# Patient Record
Sex: Female | Born: 1945 | Race: White | Hispanic: No | Marital: Married | Smoking: Never smoker
Health system: Southern US, Community
[De-identification: ages and names within clinical notes are randomized; demographics above are authoritative.]

## PROBLEM LIST (undated history)

## (undated) DIAGNOSIS — E079 Disorder of thyroid, unspecified: Secondary | ICD-10-CM

## (undated) DIAGNOSIS — I10 Essential (primary) hypertension: Secondary | ICD-10-CM

## (undated) DIAGNOSIS — I639 Cerebral infarction, unspecified: Secondary | ICD-10-CM

## (undated) HISTORY — PX: TONSILLECTOMY: SUR1361

---

## 2005-12-18 ENCOUNTER — Other Ambulatory Visit: Payer: Self-pay

## 2005-12-18 ENCOUNTER — Emergency Department: Payer: Self-pay | Admitting: Emergency Medicine

## 2006-10-29 ENCOUNTER — Ambulatory Visit: Payer: Self-pay

## 2006-11-02 ENCOUNTER — Ambulatory Visit: Payer: Self-pay

## 2007-05-17 ENCOUNTER — Ambulatory Visit: Payer: Self-pay | Admitting: General Surgery

## 2007-11-12 ENCOUNTER — Ambulatory Visit: Payer: Self-pay | Admitting: General Surgery

## 2008-06-13 ENCOUNTER — Emergency Department: Payer: Self-pay | Admitting: Emergency Medicine

## 2012-02-26 DIAGNOSIS — E039 Hypothyroidism, unspecified: Secondary | ICD-10-CM | POA: Insufficient documentation

## 2014-05-16 DIAGNOSIS — E782 Mixed hyperlipidemia: Secondary | ICD-10-CM | POA: Diagnosis present

## 2016-11-26 ENCOUNTER — Emergency Department: Payer: Medicare Other

## 2016-11-26 ENCOUNTER — Emergency Department
Admission: EM | Admit: 2016-11-26 | Discharge: 2016-11-27 | Disposition: A | Discharge status: Home / Self Care | Payer: Medicare Other | Attending: Emergency Medicine | Admitting: Emergency Medicine

## 2016-11-26 DIAGNOSIS — I10 Essential (primary) hypertension: Secondary | ICD-10-CM | POA: Insufficient documentation

## 2016-11-26 DIAGNOSIS — R42 Dizziness and giddiness: Secondary | ICD-10-CM | POA: Diagnosis present

## 2016-11-26 DIAGNOSIS — Z8673 Personal history of transient ischemic attack (TIA), and cerebral infarction without residual deficits: Secondary | ICD-10-CM | POA: Diagnosis not present

## 2016-11-26 DIAGNOSIS — R4701 Aphasia: Secondary | ICD-10-CM | POA: Diagnosis not present

## 2016-11-26 HISTORY — DX: Essential (primary) hypertension: I10

## 2016-11-26 HISTORY — DX: Cerebral infarction, unspecified: I63.9

## 2016-11-26 HISTORY — DX: Disorder of thyroid, unspecified: E07.9

## 2016-11-26 LAB — URINALYSIS, COMPLETE (UACMP) WITH MICROSCOPIC
Bilirubin Urine: NEGATIVE
Glucose, UA: NEGATIVE mg/dL
Hgb urine dipstick: NEGATIVE
KETONES UR: 5 mg/dL — AB
Nitrite: NEGATIVE
PROTEIN: NEGATIVE mg/dL
Specific Gravity, Urine: 1.019 (ref 1.005–1.030)
pH: 5 (ref 5.0–8.0)

## 2016-11-26 LAB — BASIC METABOLIC PANEL
ANION GAP: 10 (ref 5–15)
BUN: 16 mg/dL (ref 6–20)
CALCIUM: 10.2 mg/dL (ref 8.9–10.3)
CO2: 27 mmol/L (ref 22–32)
CREATININE: 0.72 mg/dL (ref 0.44–1.00)
Chloride: 103 mmol/L (ref 101–111)
GFR calc Af Amer: >60 mL/min (ref >60)
GLUCOSE: 104 mg/dL — AB (ref 65–99)
Potassium: 4.1 mmol/L (ref 3.5–5.1)
Sodium: 140 mmol/L (ref 135–145)

## 2016-11-26 LAB — CBC
HEMATOCRIT: 41 % (ref 35.0–47.0)
Hemoglobin: 14.4 g/dL (ref 12.0–16.0)
MCH: 31.3 pg (ref 26.0–34.0)
MCHC: 35.1 g/dL (ref 32.0–36.0)
MCV: 89.1 fL (ref 80.0–100.0)
Platelets: 222 10*3/uL (ref 150–440)
RBC: 4.6 MIL/uL (ref 3.80–5.20)
RDW: 13.4 % (ref 11.5–14.5)
WBC: 6.6 10*3/uL (ref 3.6–11.0)

## 2016-11-26 NOTE — ED Notes (Signed)
Patient transported to MRI by Narda Rutherford, NT

## 2016-11-26 NOTE — ED Provider Notes (Signed)
[facility] [place] Emergency Department Provider Note   First MD Initiated Contact with Patient 11/26/16 2307     (approximate)  I have reviewed the triage vital signs and the nursing notes.   HISTORY  Chief Complaint Dizziness   HPI Leah Jacobs is a 70 y.o. female with blow list of chronic medical conditions presents to the emergency department with two-year history of expressive aphasia and vertiginous symptoms. Patient denies any headache no nausea vomiting. Patient denies any weakness numbness. Per the patient's husband the patient refused to seek medical attention until recently regarding her symptoms.   Past Medical History:  Diagnosis Date  . Hypertension   . Stroke (HCC)   . Thyroid disease     There are no active problems to display for this patient.   Past Surgical History:  Procedure Laterality Date  . TONSILLECTOMY      Prior to Admission medications   Not on File    Allergies Gadolinium derivatives and Neosporin [neomycin-bacitracin zn-polymyx]  No family history on file.  Social History Social History  Substance Use Topics  . Smoking status: Never Smoker  . Smokeless tobacco: Never Used  . Alcohol use Yes    Review of Systems Constitutional: No fever/chills Eyes: No visual changes. ENT: No sore throat. Cardiovascular: Denies chest pain. Respiratory: Denies shortness of breath. Gastrointestinal: No abdominal pain.  No nausea, no vomiting.  No diarrhea.  No constipation. Genitourinary: Negative for dysuria. Musculoskeletal: Negative for neck pain.  Negative for back pain. Integumentary: Negative for rash. Neurological: Negative for headaches, focal weakness or numbness.positive for expressive aphasia and vertigo   ____________________________________________   PHYSICAL EXAM:  VITAL SIGNS: ED Triage Vitals [11/26/16 1840]  Enc Vitals Group     BP (!) 146/63     Pulse Rate (!) 59     Resp 16     Temp 97.7 F  (36.5 C)     Temp Source Oral     SpO2 98 %     Weight 72.1 kg (159 lb)     Height 1.575 m ( )     Head Circumference      Peak Flow      Pain Score      Pain Loc      Pain Edu?      Excl. in GC?     Constitutional: Alert and oriented. Well appearing and in no acute distress. Eyes: Conjunctivae are normal.  Head: Atraumatic. Mouth/Throat: Mucous membranes are moist.  Oropharynx non-erythematous. Neck: No stridor.  Cardiovascular: Normal rate, regular rhythm. Good peripheral circulation. Grossly normal heart sounds. Respiratory: Normal respiratory effort.  No retractions. Lungs CTAB. Gastrointestinal: Soft and nontender. No distention.  Musculoskeletal: No lower extremity tenderness nor edema. No gross deformities of extremities. Neurologic:  expressive aphasia. No gross focal neurologic deficits are appreciated.  Skin:  Skin is warm, dry and intact. No rash noted. Psychiatric: Mood and affect are normal. Speech and behavior are normal.  ____________________________________________   LABS (all labs ordered are listed, but only abnormal results are displayed)  Labs Reviewed  BASIC METABOLIC PANEL - Abnormal; Notable for the following:       Result Value   Glucose, Bld 104 (*)    All other components within normal limits  URINALYSIS, COMPLETE (UACMP) WITH MICROSCOPIC - Abnormal; Notable for the following:    Color, Urine AMBER (*)    APPearance HAZY (*)    Ketones, ur 5 (*)    Leukocytes, UA SMALL (*)  Bacteria, UA RARE (*)    Squamous Epithelial / LPF 0-5 (*)    All other components within normal limits  CBC   ____________________________________________  EKG  ED ECG REPORT I, Juneau N Kensleigh Gates, the attending physician, personally viewed and interpreted this ECG.   Date: 11/26/2016  EKG Time:6:48 PM  Rate: 58  Rhythm: Sinus bradycardia  Axis: Normal  Intervals:Normal  ST&T Change: none  ____________________________________________  RADIOLOGY I,  Hall N Grayce Budden, personally viewed and evaluated these images (plain radiographs) as part of my medical decision making, as well as reviewing the written report by the radiologist.  CLINICAL DATA:  Central vertigo  EXAM: MRA NECK WITHOUT CONTRAST  MRA HEAD WITHOUT CONTRAST  TECHNIQUE: Angiographic images of the neck were obtained using MRA technique without intravenous contrast.; Angiographic images of the Circle of Willis were obtained using MRA technique without intravenous contrast.  COMPARISON:  None.  FINDINGS: MRA NECK FINDINGS  Left dominant vertebral system. Normal course and caliber of the vertebral arteries along their V2 segments. Normal appearance of the common and internal carotid arteries bilaterally.  MRA HEAD FINDINGS  Intracranial internal carotid arteries: Normal.  Anterior cerebral arteries: Normal.  Middle cerebral arteries: Normal.  Posterior communicating arteries: Present bilaterally.  Posterior cerebral arteries: Normal.  Basilar artery: Normal.  Vertebral arteries: Left dominant. Normal.  Superior cerebellar arteries: Normal.  Anterior inferior cerebellar arteries: Normal.  Posterior inferior cerebellar arteries: Normal.  IMPRESSION: Normal MRA of the head and neck.   Electronically Signed   By: Deatra Robinson M.D.   On: 11/27/2016 01:14  CLINICAL DATA:  Dizziness and aphasia  EXAM: MRI HEAD WITHOUT CONTRAST  TECHNIQUE: Multiplanar, multiecho pulse sequences of the brain and surrounding structures were obtained without intravenous contrast.  COMPARISON:  MRA of the head and neck 11/26/2016  FINDINGS: Brain: The midline structures are normal. There is no focal diffusion restriction to indicate acute infarct. Mild white matter changes for age. No intraparenchymal hematoma or chronic microhemorrhage. Brain volume is normal for age without lobar predominant atrophy. The dura is normal and there is no  extra-axial collection.  Vascular: Major intracranial arterial and venous sinus flow voids are preserved.  Skull and upper cervical spine: The visualized skull base, calvarium, upper cervical spine and extracranial soft tissues are normal.  Sinuses/Orbits: No fluid levels or advanced mucosal thickening. No mastoid or middle ear effusion. Normal orbits.  IMPRESSION: 1. No acute intracranial abnormality or finding to explain the reported dizziness. 2. Mild atrophy and chronic ischemic white matter changes.   Electronically Signed   By: Deatra Robinson M.D.   On: 11/27/2016 03:22   Procedures   ____________________________________________   INITIAL IMPRESSION / ASSESSMENT AND PLAN / ED COURSE  Pertinent labs & imaging results that were available during my care of the patient were reviewed by me and considered in my medical decision making (see chart for details).  71 year old female presenting with above stated history of physical exam. No clear exudation for the patient's expressive aphasia noted. MRI/MRA of the brain revealed no acute intracranial abnormality. Patient referred to primary care provider for further outpatient evaluation for chronic expressive aphasia 2 years     ____________________________________________  FINAL CLINICAL IMPRESSION(S) / ED DIAGNOSES  Final diagnoses:  Expressive aphasia     MEDICATIONS GIVEN DURING THIS VISIT:  Medications - No data to display   NEW OUTPATIENT MEDICATIONS STARTED DURING THIS VISIT:  There are no discharge medications for this patient.   There are no discharge medications  for this patient.   There are no discharge medications for this patient.    Note:  This document was prepared using Dragon voice recognition software and may include unintentional dictation errors.    Darci Current, MD 11/28/16 [phone]

## 2016-11-26 NOTE — ED Notes (Signed)
Pt has had dizziness for approximately a year. Pt has been on two medications that cause dizziness. Pt's doctor told her to come here to be evaluated. Pt denying HA or visual changes. Pt stating "the room is spinning." Pt's stating that she is supposed to be following up with a neurologist for her sx at 02/06/2017. Pt denying n/v.

## 2016-11-26 NOTE — ED Triage Notes (Signed)
Pt is here with her husband, states she has been having dizziness for several months and is not getting any relief with meclizine. Denies N/V/D or other neuro changes at present. Pt has expressive aphasia from previous.

## 2016-11-27 ENCOUNTER — Emergency Department: Payer: Medicare Other

## 2016-11-27 DIAGNOSIS — R4701 Aphasia: Secondary | ICD-10-CM | POA: Diagnosis not present

## 2016-11-27 NOTE — ED Notes (Signed)
Pt and pt's family updated that the doctor would be there to see her soon to discuss results.

## 2016-11-27 NOTE — ED Notes (Signed)
Nurse updated pt and pt's family that pt will have to return to MRI for an additional scan. Nurse explained that we needed a different type of picture of the brain to make sure that there was not damage to the actual brain vs. Vessels.

## 2016-11-27 NOTE — ED Notes (Signed)
Patient transported to MRI with Alissa, NT

## 2016-11-27 NOTE — ED Notes (Signed)
Pt returned from MRI °

## 2016-11-27 NOTE — ED Notes (Signed)
Dr. Manson Passey is aware that MRI has resulted. Pt's family is aware that the doctor will be speaking to them about results.

## 2016-11-27 NOTE — ED Notes (Signed)
Nurse assisted pt with removing her earrings, necklace, ring, watch and pants. All items were given to pt's spouse.

## 2017-03-11 DIAGNOSIS — G4733 Obstructive sleep apnea (adult) (pediatric): Secondary | ICD-10-CM | POA: Diagnosis present

## 2017-04-13 DIAGNOSIS — R001 Bradycardia, unspecified: Secondary | ICD-10-CM | POA: Insufficient documentation

## 2017-09-23 DIAGNOSIS — F0280 Dementia in other diseases classified elsewhere without behavioral disturbance: Secondary | ICD-10-CM

## 2017-09-23 DIAGNOSIS — G3101 Pick's disease: Secondary | ICD-10-CM

## 2017-09-23 DIAGNOSIS — R42 Dizziness and giddiness: Secondary | ICD-10-CM

## 2017-09-23 DIAGNOSIS — H903 Sensorineural hearing loss, bilateral: Secondary | ICD-10-CM | POA: Diagnosis present

## 2017-11-24 DIAGNOSIS — R42 Dizziness and giddiness: Secondary | ICD-10-CM | POA: Insufficient documentation

## 2018-04-02 ENCOUNTER — Encounter: Payer: Self-pay | Admitting: *Deleted

## 2018-04-02 ENCOUNTER — Emergency Department
Admission: EM | Admit: 2018-04-02 | Discharge: 2018-04-04 | Disposition: A | Discharge status: Other Acute Inpatient Hospital | Payer: Medicare Other | Attending: Emergency Medicine | Admitting: Emergency Medicine

## 2018-04-02 DIAGNOSIS — R45851 Suicidal ideations: Secondary | ICD-10-CM

## 2018-04-02 DIAGNOSIS — G3101 Pick's disease: Secondary | ICD-10-CM | POA: Diagnosis not present

## 2018-04-02 DIAGNOSIS — I491 Atrial premature depolarization: Secondary | ICD-10-CM

## 2018-04-02 DIAGNOSIS — F329 Major depressive disorder, single episode, unspecified: Secondary | ICD-10-CM | POA: Diagnosis present

## 2018-04-02 DIAGNOSIS — F0280 Dementia in other diseases classified elsewhere without behavioral disturbance: Secondary | ICD-10-CM | POA: Insufficient documentation

## 2018-04-02 DIAGNOSIS — F32A Depression, unspecified: Secondary | ICD-10-CM

## 2018-04-02 DIAGNOSIS — I10 Essential (primary) hypertension: Secondary | ICD-10-CM | POA: Insufficient documentation

## 2018-04-02 LAB — COMPREHENSIVE METABOLIC PANEL
ALBUMIN: 4.5 g/dL (ref 3.5–5.0)
ALT: 22 U/L (ref 0–44)
ANION GAP: 9 (ref 5–15)
AST: 29 U/L (ref 15–41)
Alkaline Phosphatase: 60 U/L (ref 38–126)
BUN: 20 mg/dL (ref 8–23)
CALCIUM: 9.7 mg/dL (ref 8.9–10.3)
CHLORIDE: 101 mmol/L (ref 98–111)
CO2: 26 mmol/L (ref 22–32)
Creatinine, Ser: 0.74 mg/dL (ref 0.44–1.00)
GFR calc non Af Amer: >60 mL/min (ref >60)
GLUCOSE: 134 mg/dL — AB (ref 70–99)
POTASSIUM: 4 mmol/L (ref 3.5–5.1)
SODIUM: 136 mmol/L (ref 135–145)
Total Bilirubin: 0.7 mg/dL (ref 0.3–1.2)
Total Protein: 7.7 g/dL (ref 6.5–8.1)

## 2018-04-02 LAB — CBC
HCT: 41.5 % (ref 36.0–46.0)
Hemoglobin: 13.8 g/dL (ref 12.0–15.0)
MCH: 29.9 pg (ref 26.0–34.0)
MCHC: 33.3 g/dL (ref 30.0–36.0)
MCV: 89.8 fL (ref 80.0–100.0)
Platelets: 283 10*3/uL (ref 150–400)
RBC: 4.62 MIL/uL (ref 3.87–5.11)
RDW: 13.1 % (ref 11.5–15.5)
WBC: 8.8 10*3/uL (ref 4.0–10.5)
nRBC: 0 % (ref 0.0–0.2)

## 2018-04-02 LAB — URINALYSIS, COMPLETE (UACMP) WITH MICROSCOPIC
BILIRUBIN URINE: NEGATIVE
Glucose, UA: NEGATIVE mg/dL
HGB URINE DIPSTICK: NEGATIVE
KETONES UR: NEGATIVE mg/dL
LEUKOCYTES UA: NEGATIVE
NITRITE: NEGATIVE
Protein, ur: NEGATIVE mg/dL
SPECIFIC GRAVITY, URINE: 1.006 (ref 1.005–1.030)
pH: 7 (ref 5.0–8.0)

## 2018-04-02 LAB — URINE DRUG SCREEN, QUALITATIVE (ARMC ONLY)
Amphetamines, Ur Screen: NOT DETECTED
Barbiturates, Ur Screen: NOT DETECTED
Benzodiazepine, Ur Scrn: NOT DETECTED
CANNABINOID 50 NG, UR ~~LOC~~: NOT DETECTED
Cocaine Metabolite,Ur ~~LOC~~: NOT DETECTED
MDMA (ECSTASY) UR SCREEN: NOT DETECTED
Methadone Scn, Ur: NOT DETECTED
Opiate, Ur Screen: NOT DETECTED
Phencyclidine (PCP) Ur S: NOT DETECTED
Tricyclic, Ur Screen: NOT DETECTED

## 2018-04-02 LAB — ETHANOL: Alcohol, Ethyl (B): <10 mg/dL (ref <10)

## 2018-04-02 LAB — SALICYLATE LEVEL: Salicylate Lvl: <7 mg/dL (ref 2.8–30.0)

## 2018-04-02 LAB — ACETAMINOPHEN LEVEL

## 2018-04-02 MED ORDER — VENLAFAXINE HCL ER 75 MG PO CP24
75.0000 mg | ORAL_CAPSULE | Freq: Every day | ORAL | Status: DC
  Administered 2018-04-03: 75 mg via ORAL
  Filled 2018-04-02 (×2): qty 1

## 2018-04-02 MED ORDER — IRBESARTAN 75 MG PO TABS
37.5000 mg | ORAL_TABLET | Freq: Every day | ORAL | Status: DC
  Administered 2018-04-03 – 2018-04-04 (×2): 37.5 mg via ORAL
  Filled 2018-04-02 (×2): qty 0.5

## 2018-04-02 MED ORDER — LISINOPRIL 10 MG PO TABS
20.0000 mg | ORAL_TABLET | Freq: Every day | ORAL | Status: DC
  Administered 2018-04-03 – 2018-04-04 (×2): 20 mg via ORAL
  Filled 2018-04-02 (×2): qty 2

## 2018-04-02 MED ORDER — AMLODIPINE BESYLATE 5 MG PO TABS
10.0000 mg | ORAL_TABLET | Freq: Every day | ORAL | Status: DC
  Administered 2018-04-03 – 2018-04-04 (×2): 10 mg via ORAL
  Filled 2018-04-02 (×2): qty 2

## 2018-04-02 MED ORDER — LEVOTHYROXINE SODIUM 50 MCG PO TABS
75.0000 ug | ORAL_TABLET | Freq: Every day | ORAL | Status: DC
  Administered 2018-04-03 – 2018-04-04 (×2): 75 ug via ORAL
  Filled 2018-04-02 (×2): qty 2

## 2018-04-02 MED ORDER — MECLIZINE HCL 25 MG PO TABS
12.5000 mg | ORAL_TABLET | Freq: Three times a day (TID) | ORAL | Status: DC | PRN
  Administered 2018-04-04: 12.5 mg via ORAL
  Filled 2018-04-02: qty 1

## 2018-04-02 MED ORDER — SULFAMETHOXAZOLE-TRIMETHOPRIM 800-160 MG PO TABS
1.0000 | ORAL_TABLET | Freq: Two times a day (BID) | ORAL | Status: DC
  Administered 2018-04-02 – 2018-04-04 (×5): 1 via ORAL
  Filled 2018-04-02 (×5): qty 1

## 2018-04-02 NOTE — ED Notes (Signed)
BEHAVIORAL HEALTH ROUNDING Patient sleeping: No. Patient alert and oriented: yes Behavior appropriate: Yes.  ; If no, describe:  Nutrition and fluids offered: yes Toileting and hygiene offered: Yes  Sitter present: q15 minute observations and security  monitoring Law enforcement present: Yes  ODS  

## 2018-04-02 NOTE — ED Notes (Addendum)
Pt verbalizes depression with SI and a plan to use a gun that is in her house  I spoke with her husband and he reports that the gun was locked into a safe in the home  - ammunition stored separately from the gun   She is a pt at [facility] neurology and they encouraged him to bring her here today  Husband denies any events last pm

## 2018-04-02 NOTE — ED Triage Notes (Signed)
Pt is brought in by her husband.  She has hx of primary aggressive aphasia.  At 0200 on Thursday morning pt got to her husbands gun and pointed it at him.  Pt also was refusing help and told her husband that "I want to hurt myself".  Pt tells me that she has thoughts of harming herself.  Pt is calm in triage.

## 2018-04-02 NOTE — ED Notes (Signed)
Accepted to [facility] 04/02/2018. Can leave 04/03/2018

## 2018-04-02 NOTE — ED Notes (Signed)
pts husband  Alinda Money  [phone]  Cell phone number

## 2018-04-02 NOTE — ED Notes (Signed)
Patient has been accepted to [facility].  Patient assigned to room Geriatric Acute  Unit  Accepting physician is Dr. Kevan Ny.  Call report to [phone].  Representative was MIKE.   ER Staff is aware of it:  ER Secretary  Dr.  ER MD  Dewayne Hatch Patient's Nurse

## 2018-04-02 NOTE — ED Provider Notes (Addendum)
[facility] Emergency Department Provider Note  ____________________________________________  Time seen: Approximately 1:09 PM  I have reviewed the triage vital signs and the nursing notes.   HISTORY  Chief Complaint Aggressive Behavior    HPI Leah Jacobs is a 73 y.o. female with a history of primary progressive aphasia brought by her husband for suicidal ideations.  The patient reports that she has been having thoughts about killing herself by using a gun.  Her husband reports that she has always been very afraid of guns and that he keeps his weapons and ammunition separate at the house but she has never touched her guns.  2 nights ago, he found her with a gun in her hand and initially thought she wanted to hurt him but then realized she was thinking about killing herself.  He reports that she has had significant progressive depression since her diagnosis of primary progressive aphasia given that her quality of life has been significantly impacted.  She has had significant neurologic work-up both at [facility] and at [facility] with negative work-up for stroke.  Today, there are no new medical complaints.  Homicidal ideation or hallucinations.  Past Medical History:  Diagnosis Date  . Hypertension   . Stroke (HCC)   . Thyroid disease     There are no active problems to display for this patient.   Past Surgical History:  Procedure Laterality Date  . TONSILLECTOMY        Allergies Gadolinium derivatives and Neosporin [neomycin-bacitracin zn-polymyx]  No family history on file.  Social History Social History   Tobacco Use  . Smoking status: Never Smoker  . Smokeless tobacco: Never Used  Substance Use Topics  . Alcohol use: Yes  . Drug use: No    Review of Systems Constitutional: No fever/chills. Eyes: No visual changes. ENT: No sore throat. No congestion or rhinorrhea. Cardiovascular: Denies chest pain. Denies  palpitations. Respiratory: Denies shortness of breath.  No cough. Gastrointestinal: No abdominal pain.  No nausea, no vomiting.  No diarrhea.  No constipation. Genitourinary: Negative for dysuria. Musculoskeletal: Negative for back pain. Skin: Negative for rash. Neurological: Negative for headaches. No focal numbness, tingling or weakness.  Positive progressively worsening primary progressive aphasia.  Positive imbalance with walking. Psychiatric:Has a suicidal ideations with plan to shoot herself.  No homicidal ideations or hallucinations.  ____________________________________________   PHYSICAL EXAM:  VITAL SIGNS: ED Triage Vitals  Enc Vitals Group     BP 04/02/18 1202 (!) 148/64     Pulse Rate 04/02/18 1202 73     Resp 04/02/18 1202 16     Temp 04/02/18 1202 98.2 F (36.8 C)     Temp Source 04/02/18 1202 Oral     SpO2 04/02/18 1202 98 %     Weight 04/02/18 1203 159 lb (72.1 kg)     Height 04/02/18 1203 5' (1.524 m)     Head Circumference --      Peak Flow --      Pain Score 04/02/18 1203 0     Pain Loc --      Pain Edu? --      Excl. in GC? --     Constitutional: Alert and oriented. Answers questions appropriately. Eyes: Conjunctivae are normal.  EOMI. No scleral icterus. Head: Atraumatic. Nose: No congestion/rhinnorhea. Mouth/Throat: Mucous membranes are moist.  Neck: No stridor.  Supple.   Cardiovascular: Normal rate, regular rhythm. No murmurs, rubs or gallops.  Respiratory: Normal respiratory effort.  No accessory muscle  use or retractions. Lungs CTAB.  No wheezes, rales or ronchi. Musculoskeletal: Moves all extremities well.   Neurologic:  A&Ox3.  Speech is clear but Patient is slow to answer questions.  Face and smile are symmetric.  EOMI.  Moves all extremities well.Normal gait without ataxia. Skin:  Skin is warm, dry and intact. No rash noted. Psychiatric: The patient has a depressed mood and affect.  She has poor eye contact.  She admits to suicidal ideations  with plans to shoot herself.  She denies any homicidal ideations or hallucinations.  ____________________________________________   LABS (all labs ordered are listed, but only abnormal results are displayed)  Labs Reviewed  COMPREHENSIVE METABOLIC PANEL - Abnormal; Notable for the following components:      Result Value   Glucose, Bld 134 (*)    All other components within normal limits  ACETAMINOPHEN LEVEL - Abnormal; Notable for the following components:   Acetaminophen (Tylenol), Serum <10 (*)    All other components within normal limits  ETHANOL  SALICYLATE LEVEL  CBC  URINE DRUG SCREEN, QUALITATIVE ([facility] ONLY)  URINALYSIS, COMPLETE (UACMP) WITH MICROSCOPIC   ____________________________________________  EKG  ED ECG REPORT I, Anne-Caroline Sharma CovertNorman, the attending physician, personally viewed and interpreted this ECG.   Date: 04/02/2018  EKG Time: 1418  Rate: 68  Rhythm: normal sinus rhythm; PAC  Axis: leftward  Intervals:none  ST&T Change: No STEMI  ____________________________________________  RADIOLOGY  No results found.  ____________________________________________   PROCEDURES  Procedure(s) performed: None  Procedures  Critical Care performed: No ____________________________________________   INITIAL IMPRESSION / ASSESSMENT AND PLAN / ED COURSE  Pertinent labs & imaging results that were available during my care of the patient were reviewed by me and considered in my medical decision making (see chart for details).  73 y.o. female with a recent diagnosis of primary progressive aphasia and a significant decrease in her quality of life brought by her husband for suicidal ideations and plans to shoot herself, already exhibiting concerning gestures such as holding a gun when in the past she has always been scared of weapons.  Overall, the patient is hemodynamically stable and has no medical complaints that are new today.  I will add a urinalysis to her  basic laboratory studies to rule out this is an inciting factor and worsening of her depression.  She will be placed under involuntary commitment and a TTS and psychiatry consult have been placed.  I have had a long discussion with the patient's husband about the plan; he is in agreement.  The patient has medical clearance for psychiatric disposition.  ____________________________________________  FINAL CLINICAL IMPRESSION(S) / ED DIAGNOSES  Final diagnoses:  Depression with suicidal ideation  Primary progressive aphasia (HCC)         NEW MEDICATIONS STARTED DURING THIS VISIT:  New Prescriptions   No medications on file      Rockne MenghiniNorman, Anne-Caroline, MD 04/02/18 1317    Rockne MenghiniNorman, Anne-Caroline, MD 04/02/18 1429

## 2018-04-02 NOTE — BH Assessment (Signed)
Writer called and spoke with patient's husband, explained to him again the recommendation for inpatient treatment. Reiterated what was said in person about the risk factors and safety concerns and the reason for the recommendation. Husband continued to say he was upset with the decision and believes the wife will be better served at home. "She already have a doctor and therapy." Writer attempted to share how a higher level of care is needed at this time due to the fact she was attempting to end her life and continues to voice SI even though she have those services in place. Husband still upset with the recommendation and voiced he wanted it to be known. During the conversation the husband stated he didn't want to sound difficult but he was upset she wasn't coming home and wanted to sign her out. Writer explained she was under IVC and that wasn't an option.

## 2018-04-02 NOTE — ED Notes (Signed)

## 2018-04-02 NOTE — BH Assessment (Signed)
Referral information for Psychiatric Hospitalization faxed to;   [facility] [phone]),   . [facility] ([phone]),  . [facility] [phone], [phone], [phone] or [phone]),   . [facility] [phone]),   . [facility] [phone]),   . [facility] [phone]),   [address] [phone]),   . [facility] [phone] or [phone])  . [facility] [phone] or [phone]),   . [facility] [phone])  . [place] [phone])

## 2018-04-02 NOTE — BH Assessment (Addendum)
Assessment Note  Leah Jacobs is an 73 y.o. female who presents to the ER via her husband ([phone]), after she was advised to do so by her provider. Patient admits to having her husband's gun with the intent of ending her life. Patient's husband further reports, the patient has always been afraid of guns and would not touch or be near them. Whenever he would clean his gun, she will have him leave the room or she would leave. The patient kept the gun in the closet an the ammunition in another location. The night the patient had the gun, she had used a stool to get to the top of the closet to get the gun.  Patient reports of she has had the thought of ending her life for approximately week. She continues to have the thoughts and is unable to contract for safety.   The caused of the patient's depression is the recent change in her health. 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 She and her husband have been to several appointments with [facility] and was informed things will continue to progress. 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 She never sat still. She kept everything together and intact. Now she's depressed and really don't do as much. I know this (Aphasia) got her down..." Patient was able to communicate with this writer but it was difficult. Per report of the husband, the patient is able to walk and perform her ADL's without difficulty.  Diagnosis: Depression  Past Medical History:  Past Medical History:  Diagnosis Date  . Hypertension   . Stroke (HCC)   . Thyroid disease     Past Surgical History:  Procedure Laterality Date  . TONSILLECTOMY      Family History: No family history on file.  Social History:  reports that she has never smoked. She has never used smokeless tobacco. She reports current alcohol use. She reports that she does not use drugs.  Additional Social History:  Alcohol / Drug Use Pain Medications: See  PTA Prescriptions: See PTA Over the Counter: See PTA History of alcohol / drug use?: No history of alcohol / drug abuse Longest period of sobriety (when/how long): Reports of none Negative Consequences of Use: (n/a) Withdrawal Symptoms: (n/a)  CIWA: CIWA-Ar BP: (!) 148/64 Pulse Rate: 73 COWS:    Allergies:  Allergies  Allergen Reactions  . Gadolinium Derivatives     Unknown; pt does not remember reaction  . Neosporin [Neomycin-Bacitracin Zn-Polymyx] Rash    Home Medications: (Not in a hospital admission)   OB/GYN Status:  No LMP recorded. Patient is postmenopausal.  General Assessment Data [facility] Assessment: In system Is this a Tele or Face-to-Face Assessment?: Face-to-Face Is this an Initial Assessment or a Re-assessment for this encounter?: Initial Assessment Patient Accompanied by:: Other(Husband Alinda Money(Tony)) Language Other than English: No Living Arrangements: Other (Comment)(Private Home) What gender do you identify as?: Female Marital status: Married Pregnancy Status: No Living Arrangements: Spouse/significant other Can pt return to current living arrangement?: Yes Admission Status: Involuntary Petitioner: ED Attending Is patient capable of signing voluntary admission?: No(Under IVC) Referral Source: Self/Family/Friend Insurance type: MCR A&B  Medical Screening Exam [facility] Walk-in ONLY) Medical Exam completed: Yes  Crisis Care Plan Living Arrangements: Spouse/significant other Legal Guardian: Other:(Self) Name of Psychiatrist: Reports of none Name of Therapist: Reports of none  Education Status Is patient currently in school?: No Is the patient employed, unemployed or receiving disability?: Unemployed  Risk to self with the past 6 months Suicidal Ideation: Yes-Currently Present Has patient been a risk to self within the past 6 months prior to admission? : Yes Suicidal Intent: Yes-Currently Present Has patient had any suicidal  intent within the past 6 months prior to admission? : Yes Is patient at risk for suicide?: Yes Suicidal Plan?: Yes-Currently Present Has patient had any suicidal plan within the past 6 months prior to admission? : Yes Specify Current Suicidal Plan: with a gun Access to Means: Yes Specify Access to Suicidal Means: Shoot herself with a gun What has been your use of drugs/alcohol within the last 12 months?: Reports of none Previous Attempts/Gestures: No How many times?: 0 Other Self Harm Risks: Reports of none Triggers for Past Attempts: None known Intentional Self Injurious Behavior: None Family Suicide History: No Recent stressful life event(s): Other (Comment) Persecutory voices/beliefs?: No Depression: Yes Depression Symptoms: Insomnia, Tearfulness, Isolating, Loss of interest in usual pleasures, Feeling worthless/self pity Substance abuse history and/or treatment for substance abuse?: No Suicide prevention information given to non-admitted patients: Not applicable  Risk to Others within the past 6 months Homicidal Ideation: No Does patient have any lifetime risk of violence toward others beyond the six months prior to admission? : No Thoughts of Harm to Others: No Current Homicidal Intent: No Current Homicidal Plan: No Access to Homicidal Means: No Identified Victim: Reports of none History of harm to others?: No Assessment of Violence: None Noted Violent Behavior Description: Reports of none Does patient have access to weapons?: No Criminal Charges Pending?: No Does patient have a court date: No Is patient on probation?: No  Psychosis Hallucinations: None noted Delusions: None noted  Mental Status Report Appearance/Hygiene: Unremarkable, In scrubs Eye Contact: Good Motor Activity: Freedom of movement Speech: Aphasic, Soft Level of Consciousness: Alert Mood: Anxious Affect: Appropriate to circumstance Anxiety Level: None Thought Processes: Coherent,  Relevant Judgement: Unimpaired Orientation: Person, Place, Time, Situation, Appropriate for developmental age Obsessive Compulsive Thoughts/Behaviors: Minimal  Cognitive Functioning Concentration: Normal Memory: Recent Intact, Remote Intact Is patient IDD: No Insight: Fair Impulse Control: Fair Appetite: Fair Have you had any weight changes? : No Change Sleep: No Change Total Hours of Sleep: 6 Vegetative Symptoms: None  ADLScreening [facility] Assessment Services) Patient's cognitive ability adequate to safely complete daily activities?: Yes Patient able to express need for assistance with ADLs?: Yes Independently performs ADLs?: Yes (appropriate for developmental age)  Prior Inpatient Therapy Prior Inpatient Therapy: No  Prior Outpatient Therapy Prior Outpatient Therapy: No Does patient have an ACCT team?: No Does patient have Intensive In-House Services?  : No Does patient have [facility] services? : No Does patient have P4CC services?: No  ADL Screening (condition at time of admission) Patient's cognitive ability adequate to safely complete daily activities?: Yes Is the patient deaf or have difficulty hearing?: No Does the patient have difficulty seeing, even when wearing glasses/contacts?: No Does the patient have difficulty concentrating, remembering, or making decisions?: No Patient able to express need for assistance with ADLs?: Yes Does the patient have difficulty dressing or bathing?: No Independently performs ADLs?: Yes (appropriate for developmental age) Does the patient have difficulty walking or climbing stairs?: No Weakness of Legs: None Weakness of Arms/Hands: None  Home Assistive Devices/Equipment Home Assistive Devices/Equipment: None  Therapy Consults (therapy consults require a physician order) PT Evaluation Needed: No OT Evalulation Needed: No SLP Evaluation Needed: No Abuse/Neglect Assessment (Assessment to be complete while patient is  alone) Abuse/Neglect Assessment Can Be Completed: Yes Physical  Abuse: Denies Verbal Abuse: Denies Sexual Abuse: Denies Exploitation of patient/patient's resources: Denies Self-Neglect: Denies Values / Beliefs Cultural Requests During Hospitalization: None Spiritual Requests During Hospitalization: None Consults Spiritual Care Consult Needed: No Social Work Consult Needed: No Merchant navy officer (For Healthcare) Does Patient Have a Medical Advance Directive?: Yes Type of Advance Directive: Healthcare Power of Attorney, Living will       Child/Adolescent Assessment Running Away Risk: Denies(Patient is an adult)  Disposition:  Disposition Initial Assessment Completed for this Encounter: Yes  On Site Evaluation by:   Reviewed with Physician:    Lilyan Gilford MS, LCAS, LPC, NCC, CCSI Therapeutic Triage Specialist 04/02/2018 3:32 PM

## 2018-04-02 NOTE — ED Notes (Signed)
IVC PENDING  CONSULT ?

## 2018-04-02 NOTE — ED Notes (Signed)
All jewelry placed in specimen cup and given to her husband to take home  - 2 rings one pair of earrings and one necklace    pts belongings placed into belongings bag  Pants, red coat, shirt, undergarments , shoes  - items placed in secure storage

## 2018-04-03 DIAGNOSIS — F329 Major depressive disorder, single episode, unspecified: Secondary | ICD-10-CM | POA: Diagnosis not present

## 2018-04-03 NOTE — ED Notes (Signed)
Pt's husband visited and stated that he was upset and needed to speak to someone in management about his wife's discharged.  Husband stated that he was upset that his wife was going to be transferred to a facility that was 8 hrs away.  Arnold from TTS was contacted and agreed to come speak with the husband.  Debroah Loop stated that he had spoken with the patient's husband on the phone earlier today and had explained why the patient had been IVC'd and the circumstances surrounding her admission.  Pt's husband was allowed to visit with patient for 10 minutes.  A female visitor named Judeth Cornfield visited with patient for 5 minutes.

## 2018-04-03 NOTE — ED Notes (Signed)
Pt observed taking a bite of the sandwich and  throwing sandwich from dinner tray in the trash can in the quad hallway.  Pt eating the grapes and pretzels from the tray.

## 2018-04-03 NOTE — ED Notes (Signed)
Pt up to use restroom with no assistance need from this writer, pt with no further request at this time

## 2018-04-03 NOTE — ED Notes (Signed)
Pt asked if she would like to take a shower, but patient refused.  Pt requesting to use the telephone.

## 2018-04-03 NOTE — BH Assessment (Signed)
Met with Husband to discuss his concerns about wife being admitted to [facility] and the distance from his residence in [place]. Contacted [facility], spoke with [place], she informed me that patient will be able to get transportation. [phone].

## 2018-04-03 NOTE — ED Notes (Signed)
Pt performed hand hygiene prior to eating breakfast tray.

## 2018-04-03 NOTE — BH Assessment (Signed)
Kat at [facility] called to check to see if patient would still come. Checked with ER and informed that pt will come to Sunday 04/04/18 . Phone #[phone]

## 2018-04-03 NOTE — ED Notes (Signed)
Pt given dinner tray.

## 2018-04-03 NOTE — BH Assessment (Signed)
Spoke with husband, follow up call to re-inform him of patients current status and answer any questions. He continues to believe his wife is not a problem for him to manage and take care of. He shared he has stored all his guns in a safe, and he is the only one with a key. Informed him, no changes have been made and wife will still be admitted to [facility]. He is upset with decision and believes the doctor should have spoken more about the decision with him. Explained to husband wife is under IVC at the present time. Informed Husband he is welcome to call ER and continue to get update from the medical staff as well.

## 2018-04-03 NOTE — ED Notes (Signed)
Pt refused shower.  

## 2018-04-04 DIAGNOSIS — F329 Major depressive disorder, single episode, unspecified: Secondary | ICD-10-CM | POA: Diagnosis not present

## 2018-04-04 NOTE — BH Assessment (Signed)
17:55 [employer] calling requesting to know when the patient left the facility.  Provided with contact information for patient's previous nurse, Amy T. [phone].

## 2018-04-04 NOTE — ED Provider Notes (Signed)
-----------------------------------------   7:02 AM on 04/04/2018 -----------------------------------------   Blood pressure (!) 138/57, pulse 67, temperature 97.6 F (36.4 C), temperature source Oral, resp. rate 18, height 5' (1.524 m), weight 72.1 kg, SpO2 97 %.  The patient is calm and cooperative at this time.  There have been no acute events since the last update.  Awaiting disposition plan from Behavioral Medicine team.    Irean Hong, MD 04/04/18 [phone]

## 2018-04-04 NOTE — ED Notes (Signed)
Pt observed lying in bed   Pt visualized with NAD  No verbalized needs or concerns at this time  Continue to monitor 

## 2018-04-04 NOTE — ED Notes (Signed)
BEHAVIORAL HEALTH ROUNDING Patient sleeping: No. Patient alert and oriented: yes Behavior appropriate: Yes.  ; If no, describe:  Nutrition and fluids offered: yes Toileting and hygiene offered: Yes  Sitter present: q15 minute observations and security  monitoring Law enforcement present: Yes  ODS  

## 2018-04-04 NOTE — ED Notes (Signed)
EMTALA reviewed by this RN.  

## 2018-04-04 NOTE — BH Assessment (Signed)
Elton Sin called from [place] to inquire about transportation time/plans for patient.  Lynden Ang was informed we have not yet received the estimated arrival time from the [facility] Department.    Starla Link Kathalene Frames, PhD, [facility], LCAS 04/04/2018 10:43 AM

## 2018-04-04 NOTE — ED Notes (Signed)

## 2018-04-04 NOTE — ED Notes (Signed)
ED  Is the patient under IVC or is there intent for IVC: Yes.   Is the patient medically cleared: Yes.   Is there vacancy in the ED BHU: Yes.   Is the population mix appropriate for patient: Yes.   She is geriatric Is the patient awaiting placement in inpatient or outpatient setting: Yes.  She has been accepted at [facility]  But we are waiting for female transport to arrive  Has the patient had a psychiatric consult: Yes.   Survey of unit performed for contraband, proper placement and condition of furniture, tampering with fixtures in bathroom, shower, and each patient room: Yes.  ; Findings:  APPEARANCE/BEHAVIOR Calm and cooperative NEURO ASSESSMENT Orientation: oriented x3  Denies pain Hallucinations: No.None noted (Hallucinations) denies Speech: Normal Gait: normal RESPIRATORY ASSESSMENT Even  Unlabored respirations  CARDIOVASCULAR ASSESSMENT Pulses equal   regular rate  Skin warm and dry   GASTROINTESTINAL ASSESSMENT no GI complaint EXTREMITIES Full ROM  PLAN OF CARE Provide calm/safe environment. Vital signs assessed twice daily. ED BHU Assessment once each 12-hour shift. Collaborate with TTS daily or as condition indicates. Assure the ED provider has rounded once each shift. Provide and encourage hygiene. Provide redirection as needed. Assess for escalating behavior; address immediately and inform ED provider.  Assess family dynamic and appropriateness for visitation as needed: Yes.  ; If necessary, describe findings:  Educate the patient/family about BHU procedures/visitation: Yes.  ; If necessary, describe findings:

## 2018-04-04 NOTE — ED Notes (Signed)
Breakfast provided to her room  - NAD observed

## 2018-04-04 NOTE — ED Notes (Signed)
She is currently taking a shower - she I being assisted by [employer]

## 2018-04-20 ENCOUNTER — Other Ambulatory Visit: Payer: Self-pay

## 2018-04-20 ENCOUNTER — Emergency Department: Payer: Medicare Other

## 2018-04-20 ENCOUNTER — Inpatient Hospital Stay
Admission: EM | Admit: 2018-04-20 | Discharge: 2018-04-24 | DRG: 056 | Disposition: A | Discharge status: Home Health | Payer: Medicare Other | Attending: Internal Medicine | Admitting: Internal Medicine

## 2018-04-20 DIAGNOSIS — W109XXA Fall (on) (from) unspecified stairs and steps, initial encounter: Secondary | ICD-10-CM | POA: Diagnosis present

## 2018-04-20 DIAGNOSIS — W19XXXA Unspecified fall, initial encounter: Secondary | ICD-10-CM

## 2018-04-20 DIAGNOSIS — R296 Repeated falls: Secondary | ICD-10-CM | POA: Diagnosis present

## 2018-04-20 DIAGNOSIS — G4733 Obstructive sleep apnea (adult) (pediatric): Secondary | ICD-10-CM | POA: Diagnosis present

## 2018-04-20 DIAGNOSIS — G319 Degenerative disease of nervous system, unspecified: Secondary | ICD-10-CM | POA: Diagnosis not present

## 2018-04-20 DIAGNOSIS — R8290 Unspecified abnormal findings in urine: Secondary | ICD-10-CM | POA: Diagnosis present

## 2018-04-20 DIAGNOSIS — I10 Essential (primary) hypertension: Secondary | ICD-10-CM | POA: Diagnosis present

## 2018-04-20 DIAGNOSIS — H903 Sensorineural hearing loss, bilateral: Secondary | ICD-10-CM | POA: Diagnosis present

## 2018-04-20 DIAGNOSIS — Z79890 Hormone replacement therapy (postmenopausal): Secondary | ICD-10-CM

## 2018-04-20 DIAGNOSIS — R42 Dizziness and giddiness: Secondary | ICD-10-CM

## 2018-04-20 DIAGNOSIS — S0003XA Contusion of scalp, initial encounter: Secondary | ICD-10-CM | POA: Diagnosis present

## 2018-04-20 DIAGNOSIS — E782 Mixed hyperlipidemia: Secondary | ICD-10-CM | POA: Diagnosis present

## 2018-04-20 DIAGNOSIS — S0101XA Laceration without foreign body of scalp, initial encounter: Secondary | ICD-10-CM | POA: Diagnosis present

## 2018-04-20 DIAGNOSIS — R402212 Coma scale, best verbal response, none, at arrival to emergency department: Secondary | ICD-10-CM | POA: Diagnosis present

## 2018-04-20 DIAGNOSIS — R402142 Coma scale, eyes open, spontaneous, at arrival to emergency department: Secondary | ICD-10-CM | POA: Diagnosis present

## 2018-04-20 DIAGNOSIS — F329 Major depressive disorder, single episode, unspecified: Secondary | ICD-10-CM | POA: Diagnosis present

## 2018-04-20 DIAGNOSIS — Z23 Encounter for immunization: Secondary | ICD-10-CM

## 2018-04-20 DIAGNOSIS — R402362 Coma scale, best motor response, obeys commands, at arrival to emergency department: Secondary | ICD-10-CM | POA: Diagnosis present

## 2018-04-20 DIAGNOSIS — R4701 Aphasia: Secondary | ICD-10-CM | POA: Diagnosis present

## 2018-04-20 DIAGNOSIS — E039 Hypothyroidism, unspecified: Secondary | ICD-10-CM | POA: Diagnosis present

## 2018-04-20 DIAGNOSIS — R4182 Altered mental status, unspecified: Secondary | ICD-10-CM | POA: Diagnosis not present

## 2018-04-20 DIAGNOSIS — Z8673 Personal history of transient ischemic attack (TIA), and cerebral infarction without residual deficits: Secondary | ICD-10-CM

## 2018-04-20 DIAGNOSIS — G3101 Pick's disease: Secondary | ICD-10-CM

## 2018-04-20 DIAGNOSIS — Y92009 Unspecified place in unspecified non-institutional (private) residence as the place of occurrence of the external cause: Secondary | ICD-10-CM

## 2018-04-20 DIAGNOSIS — F0280 Dementia in other diseases classified elsewhere without behavioral disturbance: Secondary | ICD-10-CM

## 2018-04-20 LAB — CBC WITH DIFFERENTIAL/PLATELET
Abs Immature Granulocytes: 0.04 10*3/uL (ref 0.00–0.07)
Basophils Absolute: 0.1 10*3/uL (ref 0.0–0.1)
Basophils Relative: 1 %
EOS ABS: 0.7 10*3/uL — AB (ref 0.0–0.5)
Eosinophils Relative: 8 %
HCT: 37.5 % (ref 36.0–46.0)
Hemoglobin: 12.5 g/dL (ref 12.0–15.0)
IMMATURE GRANULOCYTES: 1 %
Lymphocytes Relative: 26 %
Lymphs Abs: 2.1 10*3/uL (ref 0.7–4.0)
MCH: 29.9 pg (ref 26.0–34.0)
MCHC: 33.3 g/dL (ref 30.0–36.0)
MCV: 89.7 fL (ref 80.0–100.0)
Monocytes Absolute: 0.7 10*3/uL (ref 0.1–1.0)
Monocytes Relative: 9 %
Neutro Abs: 4.7 10*3/uL (ref 1.7–7.7)
Neutrophils Relative %: 55 %
Platelets: 257 10*3/uL (ref 150–400)
RBC: 4.18 MIL/uL (ref 3.87–5.11)
RDW: 13.2 % (ref 11.5–15.5)
WBC: 8.2 10*3/uL (ref 4.0–10.5)
nRBC: 0 % (ref 0.0–0.2)

## 2018-04-20 LAB — COMPREHENSIVE METABOLIC PANEL
ALT: 30 U/L (ref 0–44)
AST: 34 U/L (ref 15–41)
Albumin: 4.2 g/dL (ref 3.5–5.0)
Alkaline Phosphatase: 50 U/L (ref 38–126)
Anion gap: 8 (ref 5–15)
BUN: 20 mg/dL (ref 8–23)
CO2: 23 mmol/L (ref 22–32)
Calcium: 8.5 mg/dL — ABNORMAL LOW (ref 8.9–10.3)
Chloride: 104 mmol/L (ref 98–111)
Creatinine, Ser: 0.68 mg/dL (ref 0.44–1.00)
GFR calc Af Amer: >60 mL/min (ref >60)
GFR calc non Af Amer: >60 mL/min (ref >60)
Glucose, Bld: 188 mg/dL — ABNORMAL HIGH (ref 70–99)
Potassium: 3.7 mmol/L (ref 3.5–5.1)
Sodium: 135 mmol/L (ref 135–145)
Total Bilirubin: 0.8 mg/dL (ref 0.3–1.2)
Total Protein: 7.1 g/dL (ref 6.5–8.1)

## 2018-04-20 LAB — TYPE AND SCREEN
ABO/RH(D): A NEG
Antibody Screen: NEGATIVE

## 2018-04-20 LAB — GLUCOSE, CAPILLARY
GLUCOSE-CAPILLARY: 115 mg/dL — AB (ref 70–99)
Glucose-Capillary: 106 mg/dL — ABNORMAL HIGH (ref 70–99)

## 2018-04-20 LAB — TROPONIN I: Troponin I: <0.03 ng/mL (ref <0.03)

## 2018-04-20 LAB — CK: Total CK: 131 U/L (ref 38–234)

## 2018-04-20 MED ORDER — ONDANSETRON HCL 4 MG PO TABS: 4.0000 mg | ORAL_TABLET | Freq: Four times a day (QID) | ORAL | Status: DC | PRN

## 2018-04-20 MED ORDER — LIDOCAINE-EPINEPHRINE 1 %-1:100000 IJ SOLN
30.0000 mL | Freq: Once | INTRAMUSCULAR | Status: AC
  Administered 2018-04-20: 30 mL
  Filled 2018-04-20: qty 30

## 2018-04-20 MED ORDER — POLYETHYLENE GLYCOL 3350 17 G PO PACK: 17.0000 g | PACK | Freq: Every day | ORAL | Status: DC | PRN

## 2018-04-20 MED ORDER — LEVOTHYROXINE SODIUM 50 MCG PO TABS
75.0000 ug | ORAL_TABLET | Freq: Every day | ORAL | Status: DC
  Administered 2018-04-21 – 2018-04-22 (×2): 75 ug via ORAL
  Filled 2018-04-20 (×2): qty 1

## 2018-04-20 MED ORDER — ACETAMINOPHEN 325 MG PO TABS
650.0000 mg | ORAL_TABLET | Freq: Four times a day (QID) | ORAL | Status: DC | PRN
  Administered 2018-04-22 – 2018-04-24 (×2): 650 mg via ORAL
  Filled 2018-04-20 (×2): qty 2

## 2018-04-20 MED ORDER — ONDANSETRON HCL 4 MG/2ML IJ SOLN: 4.0000 mg | Freq: Four times a day (QID) | INTRAMUSCULAR | Status: DC | PRN

## 2018-04-20 MED ORDER — AMLODIPINE BESYLATE 10 MG PO TABS
10.0000 mg | ORAL_TABLET | Freq: Every day | ORAL | Status: DC
  Administered 2018-04-21 – 2018-04-24 (×4): 10 mg via ORAL
  Filled 2018-04-20 (×4): qty 1

## 2018-04-20 MED ORDER — SODIUM CHLORIDE 0.9 % IV SOLN
Freq: Once | INTRAVENOUS | Status: AC
  Administered 2018-04-20: 19:00:00 via INTRAVENOUS

## 2018-04-20 MED ORDER — IOPAMIDOL (ISOVUE-300) INJECTION 61%
100.0000 mL | Freq: Once | INTRAVENOUS | Status: AC | PRN
  Administered 2018-04-20: 100 mL via INTRAVENOUS

## 2018-04-20 MED ORDER — IRBESARTAN 75 MG PO TABS
37.5000 mg | ORAL_TABLET | Freq: Every day | ORAL | Status: DC
  Filled 2018-04-20 (×2): qty 0.5

## 2018-04-20 MED ORDER — LISINOPRIL 20 MG PO TABS
20.0000 mg | ORAL_TABLET | Freq: Every day | ORAL | Status: DC
  Administered 2018-04-21 – 2018-04-24 (×4): 20 mg via ORAL
  Filled 2018-04-20 (×4): qty 1

## 2018-04-20 MED ORDER — ACETAMINOPHEN 650 MG RE SUPP: 650.0000 mg | Freq: Four times a day (QID) | RECTAL | Status: DC | PRN

## 2018-04-20 MED ORDER — VENLAFAXINE HCL ER 75 MG PO CP24
75.0000 mg | ORAL_CAPSULE | Freq: Every day | ORAL | Status: DC
  Administered 2018-04-21: 75 mg via ORAL
  Filled 2018-04-20: qty 1

## 2018-04-20 MED ORDER — INSULIN ASPART 100 UNIT/ML ~~LOC~~ SOLN
0.0000 [IU] | Freq: Three times a day (TID) | SUBCUTANEOUS | Status: DC
  Administered 2018-04-21 – 2018-04-22 (×2): 1 [IU] via SUBCUTANEOUS
  Filled 2018-04-20 (×2): qty 1

## 2018-04-20 MED ORDER — TETANUS-DIPHTH-ACELL PERTUSSIS 5-2.5-18.5 LF-MCG/0.5 IM SUSP
0.5000 mL | Freq: Once | INTRAMUSCULAR | Status: AC
  Administered 2018-04-20: 0.5 mL via INTRAMUSCULAR
  Filled 2018-04-20: qty 0.5

## 2018-04-20 NOTE — Care Management Obs Status (Signed)
MEDICARE OBSERVATION STATUS NOTIFICATION   Patient Details  Name: Leah Jacobs MRN: [id number] Date of Birth: 06-27-1945   Medicare Observation Status Notification Given:  Yes  Patient is unable to sign- husband called and MOON reviewed over the phone.  Copy printed and sent with patient to the floor.     Allayne Butcher, RN 04/20/2018, 5:23 PM

## 2018-04-20 NOTE — Progress Notes (Signed)
Patient admitted to room 117. No family present at bedside.

## 2018-04-20 NOTE — Progress Notes (Signed)
Husband to bedside to complete admission

## 2018-04-20 NOTE — ED Triage Notes (Signed)
Pt arrived via ems for report of fall down 20+ stairs (unwitnessed) - pt was found at the bottom of stairs by spouse - she follows commands at this time but is not verbal - eyes appear to be somewhat fixed to the right - Dr Roxan Hockey at bedside and pt was taken straight to CT after several staples placed to laceration in back of head

## 2018-04-20 NOTE — ED Provider Notes (Signed)
[facility] Emergency Department Provider Note    First MD Initiated Contact with Patient 04/20/18 [phone]     (approximate)  I have reviewed the triage vital signs and the nursing notes.   HISTORY  Chief Complaint trauma fall  Level V Caveat: trauma.  ams   HPI Leah Jacobs is a 73 y.o. female presents after being found at the bottom of a flight of 20 steps.  Unknown downtime.  Found by EMS with blood on the floor with wound to the back of her head.  Unable to provide any additional history.  Patient arrives with GCS of 11 4, 1, 6.  No reported blood thinners.    Past Medical History:  Diagnosis Date  . Hypertension   . Stroke (HCC)   . Thyroid disease    No family history on file. Past Surgical History:  Procedure Laterality Date  . TONSILLECTOMY     There are no active problems to display for this patient.     Prior to Admission medications   Medication Sig Start Date End Date Taking? Authorizing Provider  amLODipine (NORVASC) 10 MG tablet Take 10 mg by mouth daily. 03/17/18   [provider]  levothyroxine (SYNTHROID, LEVOTHROID) 75 MCG tablet Take 75 mcg by mouth daily before breakfast. 01/28/18   [provider]  lisinopril (PRINIVIL,ZESTRIL) 20 MG tablet Take 20 mg by mouth daily. 03/14/18   [provider]  meclizine (ANTIVERT) 12.5 MG tablet Take 12.5 mg by mouth 3 (three) times daily as needed for dizziness. 08/25/17   [provider]  valsartan (DIOVAN) 40 MG tablet Take 40 mg by mouth daily. 12/15/17 12/15/18  [provider]  venlafaxine XR (EFFEXOR-XR) 75 MG 24 hr capsule Take 75 mg by mouth daily. 12/11/17   [provider]    Allergies Gadolinium derivatives and Neosporin [neomycin-bacitracin zn-polymyx]    Social History Social History   Tobacco Use  . Smoking status: Never Smoker  . Smokeless tobacco: Never Used  Substance Use Topics  . Alcohol use: Yes  . Drug use: No      Review of Systems Unable to assess 2/2 trauma, ams ____________________________________________   PHYSICAL EXAM:  VITAL SIGNS: Vitals:   04/20/18 1000 04/20/18 1049  BP: 114/61 (!) 125/54  Pulse: 65 62  Resp: 19 15  Temp:    SpO2: 100% 100%    Constitutional: Alert, GCS of 13 (4, 1, 6) Eyes: Conjunctivae are normal.  [place]la Head: Large 4 cm stellate laceration to right occipital scalp with oozing blood.  No foreign bodies noted. Nose: No congestion/rhinnorhea. Mouth/Throat: Mucous membranes are moist.   Neck: No stridor. Painless ROM.  Cardiovascular: Normal rate, regular rhythm. Grossly normal heart sounds.  Good peripheral circulation. Respiratory: Normal respiratory effort.  No retractions. Lungs CTAB. Gastrointestinal: Soft and nontender. No distention. No abdominal bruits. No CVA tenderness. Genitourinary:  Musculoskeletal: No lower extremity tenderness nor edema.  No joint effusions. Neurologic: Patient is not speaking currently.  She will follow commands with good grip strength to bilateral upper extremities and will open hands and give thumbs up sign on command.  Withdraws to pain.  No clonus.   Skin:  Skin is warm, dry and intact.  Laceration to occipital scalp as noted above.   ____________________________________________   LABS (all labs ordered are listed, but only abnormal results are displayed)  Results for orders placed or performed during the hospital encounter of 04/20/18 (from the past 24 hour(s))  CBC with Differential/Platelet  Status: Abnormal   Collection Time: 04/20/18  9:15 AM  Result Value Ref Range   WBC 8.2 4.0 - 10.5 K/uL   RBC 4.18 3.87 - 5.11 MIL/uL   Hemoglobin 12.5 12.0 - 15.0 g/dL   HCT 16.137.5 09.636.0 - 04.546.0 %   MCV 89.7 80.0 - 100.0 fL   MCH 29.9 26.0 - 34.0 pg   MCHC 33.3 30.0 - 36.0 g/dL   RDW 40.913.2 81.111.5 - 91.415.5 %   Platelets 257 150 - 400 K/uL   nRBC 0.0 0.0 - 0.2 %   Neutrophils Relative % 55 %   Neutro Abs 4.7 1.7 - 7.7 K/uL    Lymphocytes Relative 26 %   Lymphs Abs 2.1 0.7 - 4.0 K/uL   Monocytes Relative 9 %   Monocytes Absolute 0.7 0.1 - 1.0 K/uL   Eosinophils Relative 8 %   Eosinophils Absolute 0.7 (H) 0.0 - 0.5 K/uL   Basophils Relative 1 %   Basophils Absolute 0.1 0.0 - 0.1 K/uL   Immature Granulocytes 1 %   Abs Immature Granulocytes 0.04 0.00 - 0.07 K/uL  Comprehensive metabolic panel     Status: Abnormal   Collection Time: 04/20/18  9:15 AM  Result Value Ref Range   Sodium 135 135 - 145 mmol/L   Potassium 3.7 3.5 - 5.1 mmol/L   Chloride 104 98 - 111 mmol/L   CO2 23 22 - 32 mmol/L   Glucose, Bld 188 (H) 70 - 99 mg/dL   BUN 20 8 - 23 mg/dL   Creatinine, Ser 7.820.68 0.44 - 1.00 mg/dL   Calcium 8.5 (L) 8.9 - 10.3 mg/dL   Total Protein 7.1 6.5 - 8.1 g/dL   Albumin 4.2 3.5 - 5.0 g/dL   AST 34 15 - 41 U/L   ALT 30 0 - 44 U/L   Alkaline Phosphatase 50 38 - 126 U/L   Total Bilirubin 0.8 0.3 - 1.2 mg/dL   GFR calc non Af Amer >60 >60 mL/min   GFR calc Af Amer >60 >60 mL/min   Anion gap 8 5 - 15  Type and screen [facility]     Status: None   Collection Time: 04/20/18  9:15 AM  Result Value Ref Range   ABO/RH(D) A NEG    Antibody Screen NEG    Sample Expiration      04/23/2018 Performed at [facility], [address] [place], [place] [address]   [place]     Status: None   Collection Time: 04/20/18  9:15 AM  Result Value Ref Range   Total CK 131 38 - 234 U/L  Troponin I - Add-On to previous collection     Status: None   Collection Time: 04/20/18  9:15 AM  Result Value Ref Range   Troponin I <0.03 <0.03 ng/mL   ____________________________________________  EKG My review and personal interpretation at Time: 8:47   Indication: fall  Rate: 75  Rhythm: sinus Axis: normal Other: normal intervals, no stemi ____________________________________________  RADIOLOGY  I personally reviewed all radiographic images ordered to evaluate for the above acute complaints and  reviewed radiology reports and findings.  These findings were personally discussed with the patient.  Please see medical record for radiology report.  ____________________________________________   PROCEDURES  Procedure(s) performed:  Marland Kitchen.Marland Kitchen.Laceration Repair Date/Time: 04/20/2018 9:41 AM Performed by: Willy Eddyobinson, Corby Villasenor, MD Authorized by: Willy Eddyobinson, Ramaj Frangos, MD   Consent:    Consent obtained:  Verbal   Consent given by:  Patient  Risks discussed:  Infection, pain, retained foreign body, poor cosmetic result and poor wound healing Anesthesia (see MAR for exact dosages):    Anesthesia method:  Local infiltration   Local anesthetic:  Lidocaine 1% w/o epi Laceration details:    Location:  Scalp   Scalp location:  Occipital   Length (cm):  5   Depth (mm):  5 Repair type:    Repair type:  Intermediate Exploration:    Hemostasis achieved with:  Direct pressure   Wound exploration: entire depth of wound probed and visualized     Wound extent: foreign bodies/material and vascular damage     Contaminated: no   Treatment:    Area cleansed with:  Hibiclens   Amount of cleaning:  Extensive   Visualized foreign bodies/material removed: no   Skin repair:    Repair method:  Staples   Number of staples:  15 Approximation:    Approximation:  Loose Post-procedure details:    Dressing:  Sterile dressing   Patient tolerance of procedure:  Tolerated well, no immediate complications      Critical Care performed: no ____________________________________________   INITIAL IMPRESSION / ASSESSMENT AND PLAN / ED COURSE  Pertinent labs & imaging results that were available during my care of the patient were reviewed by me and considered in my medical decision making (see chart for details).   DDX: sah, sdh, edh, fracture, contusion, soft tissue injury, viscous injury, concussion, hemorrhage   Cahterine Conneely is a 73 y.o. who presents to the ED with fall with injury as described above.  Patient  with GCS of 11.  Uncertain as to how much this is related to chronic disability versus new injury.  She is currently following commands and protecting her airway therefore do not feel that intubation clinically indicated at this time.  CT imaging will be ordered emergently to evaluate for traumatic injury.  Clinical Course as of Apr 21 1215  Tue Apr 20, 2018  0940 CT imaging fortunately shows no evidence of fracture or cervical spine injury.  Wound repaired.   [PR]  1205 Her troponin is negative.  Fortunately work-up here in the ER is reassuring.  Not acting at baseline according to husband.  Very well may be concussion related symptomatology.  Based on her frailty significant injury and fall will have patient evaluated by hospitalist.   [PR]    Clinical Course User Index [PR] Willy Eddy, MD     As part of my medical decision making, I reviewed the following data within the electronic MEDICAL RECORD NUMBER Nursing notes reviewed and incorporated, Labs reviewed, notes from prior ED visits and [place] Controlled Substance Database   ____________________________________________   FINAL CLINICAL IMPRESSION(S) / ED DIAGNOSES  Final diagnoses:  Fall, initial encounter      NEW MEDICATIONS STARTED DURING THIS VISIT:  New Prescriptions   No medications on file     Note:  This document was prepared using Dragon voice recognition software and may include unintentional dictation errors.    Willy Eddy, MD 04/20/18 1217

## 2018-04-20 NOTE — Progress Notes (Signed)
Patient ID: Leah Jacobs, female   DOB: 11-10-1945, 73 y.o.   MRN: [id number] RN just message to inform me that patient's husband does not want MRI done. She has had several testing done in the past and at this point does not want to. Will cancel MRI.

## 2018-04-20 NOTE — Care Management Note (Signed)
Case Management Note  Patient Details  Name: Allina Rothfeld MRN: [id number] Date of Birth: 07-05-1945  Subjective/Objective:    Patient is being seen in the ED after a fall down the stairs with head trauma.  Patient is from home with husband.  Husband is not currently at the bedside but RNCM reached out by phone and was able to review MOON and obtain basic assessment information.  Patient cannot speak she has progressive aphasia and is being followed by [facility].  Husband reports that the patient is independent in ADL's at home, has a walker but does not need it and she has a shower chair.  Husband reports that he is interested in getting some information on adult daycare and/or services for in the home.  Patient is being placed under observation and husband will be in to visit tomorrow.  RNCM will continue to follow. Robbie Lis RN BSN [phone]               Action/Plan:   Expected Discharge Date:                  Expected Discharge Plan:     In-House Referral:     Discharge planning Services     Post Acute Care Choice:    Choice offered to:     DME Arranged:    DME Agency:     HH Arranged:    HH Agency:     Status of Service:     If discussed at Long Length of Stay Meetings, dates discussed:    Additional Comments:  Allayne Butcher, RN 04/20/2018, 5:24 PM

## 2018-04-20 NOTE — Progress Notes (Signed)
Family Meeting Note  Advance Directive:yes Today a meeting took place with the husband  Patient has history of hypertension hypothyroidism and progressive aphasia. Lives at home with husband. She is independent with her ADLs with intermittent weakness and occasional falls without major trauma felt today a flight of stairs has posterior scalp hematoma on the right. Patient being admitted for overnight observation. Code status discussed with husband patient is a full code. Time spent 16 minutes. Enedina Finner, MD

## 2018-04-20 NOTE — ED Notes (Signed)
Pt spouse is leaving and she is attempting to climb out of bed Placed on bed alarm/mat

## 2018-04-20 NOTE — ED Notes (Addendum)
MRI called to screen pt and the spouse had left - MRI called the pt spouse at home and he stated that pt had already had to many tests and scans and that he refused to have any further testing done - Dr Roxan Hockey advised and will advise admitting MD

## 2018-04-20 NOTE — H&P (Signed)
[facility] - [place] at [facility]   PATIENT NAME: Leah Jacobs Blossom    MR#:  [id number]  DATE OF BIRTH:  02/17/1946  DATE OF ADMISSION:  04/20/2018  PRIMARY CARE PHYSICIAN: Dan Humphreys[place], [facility]   REQUESTING/REFERRING PHYSICIAN: Dr Roxan Hockeyobinson  CHIEF COMPLAINT:   History is obtained from patient's husband and ER physician. Patient has progressive aphasia and does not talk Says a few words intermittently HISTORY OF PRESENT ILLNESS:  Leah Jacobs [place]aska  is a 73 y.o. female with a known history of hypertension, hypothyroidism, progressive aphasia followed by [facility] with etiology unclear comes to the emergency room after her husband heard a loud thump this morning and found patient at the bottom of a flight of stairs. Sure how and at what steps patient slipped and fell. Brought to the emergency room she is hemodynamically stable she is awake alert. She has a right parietal scalp hematoma that was sutured by the ER physician. CAT scan of the head negative for stroke. Shows soft tissue swelling with scalp hematoma on the right parietal area. CT of the abdomen and pelvis negative for trauma patient otherwise is independent and does her ADLs by herself according to the husband. She has been having intermittent weakness and falls but no major trauma.  Being admitted for further evaluation of management and overnight observation. PAST MEDICAL HISTORY:   Past Medical History:  Diagnosis Date  . Hypertension   . Stroke (HCC)   . Thyroid disease     PAST SURGICAL HISTOIRY:   Past Surgical History:  Procedure Laterality Date  . TONSILLECTOMY      SOCIAL HISTORY:   Social History   Tobacco Use  . Smoking status: Never Smoker  . Smokeless tobacco: Never Used  Substance Use Topics  . Alcohol use: Yes    FAMILY HISTORY:  No family history on file.  DRUG ALLERGIES:   Allergies  Allergen Reactions  . Sulfa Antibiotics Hives  . Gadolinium Derivatives      Unknown; pt does not remember reaction  . Neosporin [Neomycin-Bacitracin Zn-Polymyx] Rash    REVIEW OF SYSTEMS:  Review of Systems  Unable to perform ROS: Medical condition     MEDICATIONS AT HOME:   Prior to Admission medications   Medication Sig Start Date End Date Taking? Authorizing Provider  amLODipine (NORVASC) 10 MG tablet Take 10 mg by mouth daily. 03/17/18  Yes [provider]  levothyroxine (SYNTHROID, LEVOTHROID) 75 MCG tablet Take 75 mcg by mouth daily before breakfast. 01/28/18  Yes [provider]  lisinopril (PRINIVIL,ZESTRIL) 20 MG tablet Take 20 mg by mouth daily. 03/14/18  Yes [provider]  valsartan (DIOVAN) 40 MG tablet Take 40 mg by mouth daily. 12/15/17 12/15/18 Yes [provider]  venlafaxine XR (EFFEXOR-XR) 75 MG 24 hr capsule Take 75 mg by mouth daily. 12/11/17  Yes [provider]  Vitamin D, Ergocalciferol, (DRISDOL) 1.25 MG (50000 UT) CAPS capsule Take 50,000 Units by mouth once a week. 04/19/18  Yes [provider]  meclizine (ANTIVERT) 12.5 MG tablet Take 12.5 mg by mouth 3 (three) times daily as needed for dizziness. 08/25/17   [provider]      VITAL SIGNS:  Blood pressure (!) 125/54, pulse 62, temperature 98.4 F (36.9 C), temperature source Axillary, resp. rate 15, height 5\' 4"  (1.626 m), weight 68 kg, SpO2 100 %.  PHYSICAL EXAMINATION:  GENERAL:  73 y.o.-year-old patient lying in the bed with no acute distress.  EYES: Pupils equal,  round, reactive to light and accommodation. No scleral icterus. Extraocular muscles intact.  HEENT: right posterior parietal scope hematoma suture. Blood present in the hair, normocephalic. Oropharynx and nasopharynx clear.  NECK:  Supple, no jugular venous distention. No thyroid enlargement, no tenderness.  LUNGS: Normal breath sounds bilaterally, no wheezing, rales,rhonchi or crepitation. No use of accessory muscles of respiration.  CARDIOVASCULAR: S1, S2  normal. No murmurs, rubs, or gallops.  ABDOMEN: Soft, nontender, nondistended. Bowel sounds present. No organomegaly or mass.  EXTREMITIES: No pedal edema, cyanosis, or clubbing.  NEUROLOGIC: patient is a phasic. She is awake alert. She moves all extremities on verbal commands. No focal weakness  PSYCHIATRIC: The patient is alert and awake SKIN: No obvious rash, lesion, or ulcer.   LABORATORY PANEL:   CBC Recent Labs  Lab 04/20/18 0915  WBC 8.2  HGB 12.5  HCT 37.5  PLT 257   ------------------------------------------------------------------------------------------------------------------  Chemistries  Recent Labs  Lab 04/20/18 0915  NA 135  K 3.7  CL 104  CO2 23  GLUCOSE 188*  BUN 20  CREATININE 0.68  CALCIUM 8.5*  AST 34  ALT 30  ALKPHOS 50  BILITOT 0.8   ------------------------------------------------------------------------------------------------------------------  Cardiac Enzymes Recent Labs  Lab 04/20/18 0915  TROPONINI <0.03   ------------------------------------------------------------------------------------------------------------------  RADIOLOGY:  Ct Head Wo Contrast  Result Date: 04/20/2018 CLINICAL DATA:  Found at the bottom of 20 steps by her husband this morning, uncertain of injury, nonverbal since accident, prior fall resulting in occipital injury, history of hypertension and stroke EXAM: CT HEAD WITHOUT CONTRAST CT CERVICAL SPINE WITHOUT CONTRAST TECHNIQUE: Multidetector CT imaging of the head and cervical spine was performed following the standard protocol without intravenous contrast. Multiplanar CT image reconstructions of the cervical spine were also generated. COMPARISON:  None Correlation MR brain 11/27/2016 FINDINGS: CT HEAD FINDINGS Brain: Mild generalized atrophy. Normal ventricular morphology. No midline shift or mass effect. Small vessel chronic ischemic changes of deep cerebral white matter. Small scattered falcine calcifications.  Small vessel chronic ischemic changes of deep cerebral white matter, mild. No intracranial hemorrhage, mass lesion, or evidence of acute infarction. No extra-axial fluid collections. Vascular: Unremarkable Skull: Intact. Post RIGHT parietal scalp hematoma and laceration with skin clips. Sinuses/Orbits: Clear Other: N/A CT CERVICAL SPINE FINDINGS Alignment: Minimal anterolisthesis at C4-C5 likely degenerative. Remaining alignments normal. Skull base and vertebrae: Mild osseous demineralization. Visualized skull base intact. Multilevel facet degenerative changes. Disc space narrowing and endplate spur formation throughout cervical spine greatest at C5-C6. Vertebral body heights maintained without fracture or bone destruction. Soft tissues and spinal canal: Prevertebral soft tissues normal thickness. Remaining cervical soft tissues unremarkable. Disc levels: AP narrowing of spinal canal LEFT of midline at C5-C6 due to endplate spur formation asymmetrically. Similar though less severe AP narrowing at C4-C5. Upper chest: Lung apices clear Other: N/A IMPRESSION: Atrophy with minimal small vessel chronic ischemic changes of deep cerebral white matter. No acute intracranial abnormalities. Posterior RIGHT parietal scalp hematoma and laceration. Scattered degenerative disc and facet disease changes of the cervical spine. No acute cervical spine abnormalities. Electronically Signed   By: Ulyses Southward M.D.   On: 04/20/2018 09:22   Ct Chest W Contrast  Result Date: 04/20/2018 CLINICAL DATA:  Fall down stairs. EXAM: CT CHEST, ABDOMEN, AND PELVIS WITH CONTRAST TECHNIQUE: Multidetector CT imaging of the chest, abdomen and pelvis was performed following the standard protocol during bolus administration of intravenous contrast. CONTRAST:  ISOVUE-300 IOPAMIDOL (ISOVUE-300) INJECTION 61% COMPARISON:  Radiograph of same day. FINDINGS: CT CHEST  FINDINGS Cardiovascular: Atherosclerosis of thoracic aorta is noted without aneurysm  or dissection. No pericardial effusion is noted. Mediastinum/Nodes: No enlarged mediastinal, hilar, or axillary lymph nodes. Thyroid gland, trachea, and esophagus demonstrate no significant findings. Lungs/Pleura: Lungs are clear. No pleural effusion or pneumothorax. Musculoskeletal: No chest wall mass or suspicious bone lesions identified. CT ABDOMEN PELVIS FINDINGS Hepatobiliary: No focal liver abnormality is seen. No gallstones, gallbladder wall thickening, or biliary dilatation. Pancreas: Unremarkable. No pancreatic ductal dilatation or surrounding inflammatory changes. Spleen: Normal in size without focal abnormality. Adrenals/Urinary Tract: Adrenal glands are unremarkable. Kidneys are normal, without renal calculi, focal lesion, or hydronephrosis. Bladder is unremarkable. Stomach/Bowel: Stomach is within normal limits. Appendix appears normal. No evidence of bowel wall thickening, distention, or inflammatory changes. Sigmoid diverticulosis without inflammation. Vascular/Lymphatic: Aortic atherosclerosis. No enlarged abdominal or pelvic lymph nodes. Reproductive: Uterus and bilateral adnexa are unremarkable. Other: No abdominal wall hernia or abnormality. No abdominopelvic ascites. Musculoskeletal: No acute or significant osseous findings. IMPRESSION: No evidence of traumatic injury seen in the chest, abdomen or pelvis. Sigmoid diverticulosis without inflammation. Aortic Atherosclerosis (ICD10-I70.0). Electronically Signed   By: Lupita Raider, M.D.   On: 04/20/2018 10:51   Ct Cervical Spine Wo Contrast  Result Date: 04/20/2018 CLINICAL DATA:  Found at the bottom of 20 steps by her husband this morning, uncertain of injury, nonverbal since accident, prior fall resulting in occipital injury, history of hypertension and stroke EXAM: CT HEAD WITHOUT CONTRAST CT CERVICAL SPINE WITHOUT CONTRAST TECHNIQUE: Multidetector CT imaging of the head and cervical spine was performed following the standard protocol  without intravenous contrast. Multiplanar CT image reconstructions of the cervical spine were also generated. COMPARISON:  None Correlation MR brain 11/27/2016 FINDINGS: CT HEAD FINDINGS Brain: Mild generalized atrophy. Normal ventricular morphology. No midline shift or mass effect. Small vessel chronic ischemic changes of deep cerebral white matter. Small scattered falcine calcifications. Small vessel chronic ischemic changes of deep cerebral white matter, mild. No intracranial hemorrhage, mass lesion, or evidence of acute infarction. No extra-axial fluid collections. Vascular: Unremarkable Skull: Intact. Post RIGHT parietal scalp hematoma and laceration with skin clips. Sinuses/Orbits: Clear Other: N/A CT CERVICAL SPINE FINDINGS Alignment: Minimal anterolisthesis at C4-C5 likely degenerative. Remaining alignments normal. Skull base and vertebrae: Mild osseous demineralization. Visualized skull base intact. Multilevel facet degenerative changes. Disc space narrowing and endplate spur formation throughout cervical spine greatest at C5-C6. Vertebral body heights maintained without fracture or bone destruction. Soft tissues and spinal canal: Prevertebral soft tissues normal thickness. Remaining cervical soft tissues unremarkable. Disc levels: AP narrowing of spinal canal LEFT of midline at C5-C6 due to endplate spur formation asymmetrically. Similar though less severe AP narrowing at C4-C5. Upper chest: Lung apices clear Other: N/A IMPRESSION: Atrophy with minimal small vessel chronic ischemic changes of deep cerebral white matter. No acute intracranial abnormalities. Posterior RIGHT parietal scalp hematoma and laceration. Scattered degenerative disc and facet disease changes of the cervical spine. No acute cervical spine abnormalities. Electronically Signed   By: Ulyses Southward M.D.   On: 04/20/2018 09:22   Ct Abdomen Pelvis W Contrast  Result Date: 04/20/2018 CLINICAL DATA:  Fall down stairs. EXAM: CT CHEST,  ABDOMEN, AND PELVIS WITH CONTRAST TECHNIQUE: Multidetector CT imaging of the chest, abdomen and pelvis was performed following the standard protocol during bolus administration of intravenous contrast. CONTRAST:  ISOVUE-300 IOPAMIDOL (ISOVUE-300) INJECTION 61% COMPARISON:  Radiograph of same day. FINDINGS: CT CHEST FINDINGS Cardiovascular: Atherosclerosis of thoracic aorta is noted without aneurysm or dissection. No pericardial  effusion is noted. Mediastinum/Nodes: No enlarged mediastinal, hilar, or axillary lymph nodes. Thyroid gland, trachea, and esophagus demonstrate no significant findings. Lungs/Pleura: Lungs are clear. No pleural effusion or pneumothorax. Musculoskeletal: No chest wall mass or suspicious bone lesions identified. CT ABDOMEN PELVIS FINDINGS Hepatobiliary: No focal liver abnormality is seen. No gallstones, gallbladder wall thickening, or biliary dilatation. Pancreas: Unremarkable. No pancreatic ductal dilatation or surrounding inflammatory changes. Spleen: Normal in size without focal abnormality. Adrenals/Urinary Tract: Adrenal glands are unremarkable. Kidneys are normal, without renal calculi, focal lesion, or hydronephrosis. Bladder is unremarkable. Stomach/Bowel: Stomach is within normal limits. Appendix appears normal. No evidence of bowel wall thickening, distention, or inflammatory changes. Sigmoid diverticulosis without inflammation. Vascular/Lymphatic: Aortic atherosclerosis. No enlarged abdominal or pelvic lymph nodes. Reproductive: Uterus and bilateral adnexa are unremarkable. Other: No abdominal wall hernia or abnormality. No abdominopelvic ascites. Musculoskeletal: No acute or significant osseous findings. IMPRESSION: No evidence of traumatic injury seen in the chest, abdomen or pelvis. Sigmoid diverticulosis without inflammation. Aortic Atherosclerosis (ICD10-I70.0). Electronically Signed   By: Lupita RaiderJames  Green Jr, M.D.   On: 04/20/2018 10:51   Dg Pelvis Portable  Result Date:  04/20/2018 CLINICAL DATA:  Pain following fall EXAM: PORTABLE PELVIS 1-2 VIEWS COMPARISON:  None. FINDINGS: There is no evidence of pelvic fracture or dislocation. There is mild symmetric narrowing of each hip joint. No erosive change. IMPRESSION: No fracture or dislocation. Slight symmetric narrowing of each hip joint. Electronically Signed   By: Bretta BangWilliam  Woodruff III M.D.   On: 04/20/2018 09:27   Dg Chest Portable 1 View  Result Date: 04/20/2018 CLINICAL DATA:  Pain following fall EXAM: PORTABLE CHEST 1 VIEW COMPARISON:  None. FINDINGS: Lungs are clear. Heart is upper normal in size with pulmonary vascularity within normal limits. No adenopathy. No pneumothorax. No bone lesions. IMPRESSION: No edema or consolidation.  No pneumothorax. Electronically Signed   By: Bretta BangWilliam  Woodruff III M.D.   On: 04/20/2018 09:28    EKG:  normal sinus rhythm. LVH  IMPRESSION AND PLAN:   Leah Jacobs  is a 73 y.o. female with a known history of hypertension, hypothyroidism, progressive aphasia followed by [facility] with etiology unclear comes to the emergency room after her husband heard a loud thump this morning and found patient at the bottom of a flight of stairs. Sure how and at what steps patient slipped and fell.  1. Right posterior scalp hematoma status post fall today at home flight of stairs. -Details unknown. -Patient unable to give history given progressive aphasia -CT had negative for stroke. Shows hematoma right parietal scalp area sutured by ER physician -continue to monitor neuro- status -physical therapy to see patient  2. Hypertension continue home meds -blood pressure has been stable  3. Hypothyroidism continue Synthroid  4. History of progressive aphasia etiology unknown. Patient follows with [facility]  5. DVT prophylaxis SCD  Discussed with husband    All the records are reviewed and case discussed with ED provider.   CODE STATUS: full  TOTAL TIME TAKING CARE OF  THIS PATIENT: *45* minutes.    Enedina FinnerSona Grafton Warzecha M.D on 04/20/2018 at 12:53 PM  Between 7am to 6pm - Pager - [phone]  After 6pm go to [email or web address] - password [email or web address] [email or web address]  [facility]  Office  [phone]  CC: Primary care physician; Jerrilyn Cairo[place], [facility]

## 2018-04-21 ENCOUNTER — Observation Stay: Payer: Medicare Other

## 2018-04-21 DIAGNOSIS — Z79890 Hormone replacement therapy (postmenopausal): Secondary | ICD-10-CM | POA: Diagnosis not present

## 2018-04-21 DIAGNOSIS — W109XXA Fall (on) (from) unspecified stairs and steps, initial encounter: Secondary | ICD-10-CM

## 2018-04-21 DIAGNOSIS — R402212 Coma scale, best verbal response, none, at arrival to emergency department: Secondary | ICD-10-CM | POA: Diagnosis present

## 2018-04-21 DIAGNOSIS — F329 Major depressive disorder, single episode, unspecified: Secondary | ICD-10-CM | POA: Diagnosis present

## 2018-04-21 DIAGNOSIS — G319 Degenerative disease of nervous system, unspecified: Secondary | ICD-10-CM | POA: Diagnosis present

## 2018-04-21 DIAGNOSIS — Z23 Encounter for immunization: Secondary | ICD-10-CM | POA: Diagnosis present

## 2018-04-21 DIAGNOSIS — Y92009 Unspecified place in unspecified non-institutional (private) residence as the place of occurrence of the external cause: Secondary | ICD-10-CM | POA: Diagnosis not present

## 2018-04-21 DIAGNOSIS — R4701 Aphasia: Secondary | ICD-10-CM | POA: Diagnosis present

## 2018-04-21 DIAGNOSIS — S0003XA Contusion of scalp, initial encounter: Secondary | ICD-10-CM

## 2018-04-21 DIAGNOSIS — R4182 Altered mental status, unspecified: Secondary | ICD-10-CM | POA: Diagnosis present

## 2018-04-21 DIAGNOSIS — I10 Essential (primary) hypertension: Secondary | ICD-10-CM | POA: Diagnosis present

## 2018-04-21 DIAGNOSIS — E782 Mixed hyperlipidemia: Secondary | ICD-10-CM | POA: Diagnosis present

## 2018-04-21 DIAGNOSIS — R402142 Coma scale, eyes open, spontaneous, at arrival to emergency department: Secondary | ICD-10-CM | POA: Diagnosis present

## 2018-04-21 DIAGNOSIS — G4733 Obstructive sleep apnea (adult) (pediatric): Secondary | ICD-10-CM | POA: Diagnosis present

## 2018-04-21 DIAGNOSIS — R296 Repeated falls: Secondary | ICD-10-CM | POA: Diagnosis present

## 2018-04-21 DIAGNOSIS — R8290 Unspecified abnormal findings in urine: Secondary | ICD-10-CM | POA: Diagnosis present

## 2018-04-21 DIAGNOSIS — Z8673 Personal history of transient ischemic attack (TIA), and cerebral infarction without residual deficits: Secondary | ICD-10-CM | POA: Diagnosis not present

## 2018-04-21 DIAGNOSIS — S0101XA Laceration without foreign body of scalp, initial encounter: Secondary | ICD-10-CM | POA: Diagnosis present

## 2018-04-21 DIAGNOSIS — R402362 Coma scale, best motor response, obeys commands, at arrival to emergency department: Secondary | ICD-10-CM | POA: Diagnosis present

## 2018-04-21 DIAGNOSIS — E039 Hypothyroidism, unspecified: Secondary | ICD-10-CM | POA: Diagnosis present

## 2018-04-21 DIAGNOSIS — H903 Sensorineural hearing loss, bilateral: Secondary | ICD-10-CM | POA: Diagnosis present

## 2018-04-21 LAB — GLUCOSE, CAPILLARY
Glucose-Capillary: 128 mg/dL — ABNORMAL HIGH (ref 70–99)
Glucose-Capillary: 99 mg/dL (ref 70–99)
Glucose-Capillary: 112 mg/dL — ABNORMAL HIGH (ref 70–99)

## 2018-04-21 LAB — TSH: TSH: 15.776 u[IU]/mL — ABNORMAL HIGH (ref 0.350–4.500)

## 2018-04-21 MED ORDER — ALPRAZOLAM 0.5 MG PO TABS
0.5000 mg | ORAL_TABLET | Freq: Once | ORAL | Status: AC | PRN
  Administered 2018-04-22: 20:00:00 0.5 mg via ORAL
  Filled 2018-04-21: qty 1

## 2018-04-21 NOTE — Progress Notes (Addendum)
Speech Therapy Note: received order, consulted NSG/MD re: pt's status today. Pt is mostly nonverbal but following basic commands; alert. She has a baseline dx of Primary Progressive Aphasia(followed by [facility]) and dizziness(~1-2 years) per chart notes; also noted a recent Behavioral Medicine Assessment during an admission to the ED in [place]. 2020.  Pt is scheduled for an MRI today post fall leading to admission. NSG reported appropriate swallowing of pills w/ liquids; though not eating much. ST services will f/u w/ pt's status tomorrow. MD/NSG updated.    Jerilynn Som, MS, CCC-SLP

## 2018-04-21 NOTE — Consult Note (Addendum)
Reason for Consult: Referring Physician: Ramonita Lab MD  CC: Fall  HPI: Leah Jacobs is an 73 y.o. female with past medical history of primary progressive aphasia following with [facility], hypertension, CVA, thyroid disease, depression with suicidal ideation, and recurrent falls presenting to the ED on 04/20/2018 following a traumatic fall with laceration to the back of the head.  Patient is unable to provide history due to aphasia therefore history mostly obtained from patient's husband.  Per patient's husband, patient fell down a flight of stairs. He did not witness the fall however he heard a loud thud and when he went to check on patient, he found her at the bottom of the stairs.  Patient's husband did not witness any seizure-like activity or loss of consciousness. Since the fall patient has become nonverbal with yes or no answers only.  She does have history of aphasia at baseline however per husband this seems to have gotten worse since the fall.  Per patient's husband report that he was unsure what caused the fall since patient was unable to explain the circumstances or event prior to or following the fall.  On arrival to the ED she was able to follow commands and was noted to be nonverbal.  CT head revealed posterior right parietal scalp hematoma and laceration.  CT cervical spine was negative for acute fractures or abnormality.  Labs in the ED unremarkable with no abnormal findings noted thus far.  Patient was therefore admitted for further evaluation and management.  Past Medical History:  Diagnosis Date  . Hypertension   . Stroke (HCC)   . Thyroid disease     Past Surgical History:  Procedure Laterality Date  . TONSILLECTOMY      History reviewed. No pertinent family history.  Social History:  reports that she has never smoked. She has never used smokeless tobacco. She reports current alcohol use. She reports that she does not use drugs.  Allergies  Allergen Reactions  .  Sulfa Antibiotics Hives  . Gadolinium Derivatives     Unknown; pt does not remember reaction  . Neosporin [Neomycin-Bacitracin Zn-Polymyx] Rash    Medications:  I have reviewed the patient's current medications. Prior to Admission:  Medications Prior to Admission  Medication Sig Dispense Refill Last Dose  . amLODipine (NORVASC) 10 MG tablet Take 10 mg by mouth daily.   04/20/2018 at 0800  . levothyroxine (SYNTHROID, LEVOTHROID) 75 MCG tablet Take 75 mcg by mouth daily before breakfast.   04/20/2018 at 0700  . lisinopril (PRINIVIL,ZESTRIL) 20 MG tablet Take 20 mg by mouth daily.   04/19/2018 at 0800  . valsartan (DIOVAN) 40 MG tablet Take 40 mg by mouth daily.   04/19/2018 at 0800  . venlafaxine XR (EFFEXOR-XR) 75 MG 24 hr capsule Take 75 mg by mouth daily.   04/19/2018 at 0800  . Vitamin D, Ergocalciferol, (DRISDOL) 1.25 MG (50000 UT) CAPS capsule Take 50,000 Units by mouth once a week.   Past Week at Unknown time  . meclizine (ANTIVERT) 12.5 MG tablet Take 12.5 mg by mouth 3 (three) times daily as needed for dizziness.   prn at prn   Scheduled: . amLODipine  10 mg Oral Daily  . insulin aspart  0-9 Units Subcutaneous TID WC  . levothyroxine  75 mcg Oral Q0600  . lisinopril  20 mg Oral Daily  . venlafaxine XR  75 mg Oral Daily    ROS: Able to obtain from patient due to aphasia  Physical Examination: Blood pressure Marland Kitchen)  135/58, pulse 79, temperature 98.2 F (36.8 C), temperature source Oral, resp. rate 16, height 5\' 4"  (1.626 m), weight 68 kg, SpO2 98 %.  HEENT-  Normocephalic, no lesions, without obvious abnormality.  Normal external eye and conjunctiva.  Normal TM's bilaterally.  Normal auditory canals and external ears. Normal external nose, mucus membranes and septum.  Normal pharynx. Cardiovascular- S1, S2 normal, pulses palpable throughout   Lungs- chest clear, no wheezing, rales, normal symmetric air entry Abdomen- soft, non-tender; bowel sounds normal; no masses,  no  organomegaly Extremities- no edema Lymph-no adenopathy palpable Musculoskeletal-no joint tenderness, deformity or swelling Skin-warm and dry, no hyperpigmentation, vitiligo, or suspicious lesions  Neurological Exam   Mental Status: Alert, Nods appropriately to orientation questions.  Expressive aphasia.  Able to follow 3 step commands without difficulty. Attention span and concentration seemed appropriate  Cranial Nerves: II: Discs flat bilaterally; Visual fields grossly normal, pupils equal, round, reactive to light and accommodation III,IV, VI: ptosis not present, extra-ocular motions intact bilaterally V,VII: smile symmetric, facial light touch sensation intact VIII: hearing normal bilaterally IX,X: gag reflex present XI: bilateral shoulder shrug XII: midline tongue extension Motor: Right :  Upper extremity   5/5 Without pronator drift      Left: Upper extremity   5/5 without pronator drift Right:   Lower extremity   5/5                                          Left: Lower extremity   5/5 Tone and bulk:normal tone throughout; no atrophy noted Sensory: Pinprick and light touch intact bilaterally Deep Tendon Reflexes: 2+ and symmetric throughout Plantars: Right: mute                              Left: mute Cerebellar: Finger-to-nose testing intact bilaterally. Heel to shin testing normal bilaterally Gait: not tested due to safety concerns  Data Reviewed  Laboratory Studies:   Basic Metabolic Panel: Recent Labs  Lab 04/20/18 0915  NA 135  K 3.7  CL 104  CO2 23  GLUCOSE 188*  BUN 20  CREATININE 0.68  CALCIUM 8.5*    Liver Function Tests: Recent Labs  Lab 04/20/18 0915  AST 34  ALT 30  ALKPHOS 50  BILITOT 0.8  PROT 7.1  ALBUMIN 4.2   No results for input(s): LIPASE, AMYLASE in the last 168 hours. No results for input(s): AMMONIA in the last 168 hours.  CBC: Recent Labs  Lab 04/20/18 0915  WBC 8.2  NEUTROABS 4.7  HGB 12.5  HCT 37.5  MCV 89.7  PLT 257     Cardiac Enzymes: Recent Labs  Lab 04/20/18 0915  CKTOTAL 131  TROPONINI <0.03    BNP: Invalid input(s): POCBNP  CBG: Recent Labs  Lab 04/20/18 1839 04/20/18 2101 04/21/18 0738 04/21/18 1201  GLUCAP 106* 115* 128* 99    Microbiology: No results found for this or any previous visit.  Coagulation Studies: No results for input(s): LABPROT, INR in the last 72 hours.  Urinalysis: No results for input(s): COLORURINE, LABSPEC, PHURINE, GLUCOSEU, HGBUR, BILIRUBINUR, KETONESUR, PROTEINUR, UROBILINOGEN, NITRITE, LEUKOCYTESUR in the last 168 hours.  Invalid input(s): APPERANCEUR  Lipid Panel:  No results found for: CHOL, TRIG, HDL, CHOLHDL, VLDL, LDLCALC  HgbA1C: No results found for: HGBA1C  Urine Drug Screen:      Component  Value Date/Time   LABOPIA NONE DETECTED 04/02/2018 1205   COCAINSCRNUR NONE DETECTED 04/02/2018 1205   LABBENZ NONE DETECTED 04/02/2018 1205   AMPHETMU NONE DETECTED 04/02/2018 1205   THCU NONE DETECTED 04/02/2018 1205   LABBARB NONE DETECTED 04/02/2018 1205    Alcohol Level: No results for input(s): ETH in the last 168 hours.  Other results: EKG: normal EKG, normal sinus rhythm, unchanged from previous tracings. Vent. rate 76 BPM PR interval * ms QRS duration 105 ms QT/QTc 405/456 ms P-R-T axes 52 -39 80  Imaging: Ct Head Wo Contrast  Result Date: 04/20/2018 CLINICAL DATA:  Found at the bottom of 20 steps by her husband this morning, uncertain of injury, nonverbal since accident, prior fall resulting in occipital injury, history of hypertension and stroke EXAM: CT HEAD WITHOUT CONTRAST CT CERVICAL SPINE WITHOUT CONTRAST TECHNIQUE: Multidetector CT imaging of the head and cervical spine was performed following the standard protocol without intravenous contrast. Multiplanar CT image reconstructions of the cervical spine were also generated. COMPARISON:  None Correlation MR brain 11/27/2016 FINDINGS: CT HEAD FINDINGS Brain: Mild generalized  atrophy. Normal ventricular morphology. No midline shift or mass effect. Small vessel chronic ischemic changes of deep cerebral white matter. Small scattered falcine calcifications. Small vessel chronic ischemic changes of deep cerebral white matter, mild. No intracranial hemorrhage, mass lesion, or evidence of acute infarction. No extra-axial fluid collections. Vascular: Unremarkable Skull: Intact. Post RIGHT parietal scalp hematoma and laceration with skin clips. Sinuses/Orbits: Clear Other: N/A CT CERVICAL SPINE FINDINGS Alignment: Minimal anterolisthesis at C4-C5 likely degenerative. Remaining alignments normal. Skull base and vertebrae: Mild osseous demineralization. Visualized skull base intact. Multilevel facet degenerative changes. Disc space narrowing and endplate spur formation throughout cervical spine greatest at C5-C6. Vertebral body heights maintained without fracture or bone destruction. Soft tissues and spinal canal: Prevertebral soft tissues normal thickness. Remaining cervical soft tissues unremarkable. Disc levels: AP narrowing of spinal canal LEFT of midline at C5-C6 due to endplate spur formation asymmetrically. Similar though less severe AP narrowing at C4-C5. Upper chest: Lung apices clear Other: N/A IMPRESSION: Atrophy with minimal small vessel chronic ischemic changes of deep cerebral white matter. No acute intracranial abnormalities. Posterior RIGHT parietal scalp hematoma and laceration. Scattered degenerative disc and facet disease changes of the cervical spine. No acute cervical spine abnormalities. Electronically Signed   By: Ulyses Southward M.D.   On: 04/20/2018 09:22   Ct Chest W Contrast  Result Date: 04/20/2018 CLINICAL DATA:  Fall down stairs. EXAM: CT CHEST, ABDOMEN, AND PELVIS WITH CONTRAST TECHNIQUE: Multidetector CT imaging of the chest, abdomen and pelvis was performed following the standard protocol during bolus administration of intravenous contrast. CONTRAST:   ISOVUE-300 IOPAMIDOL (ISOVUE-300) INJECTION 61% COMPARISON:  Radiograph of same day. FINDINGS: CT CHEST FINDINGS Cardiovascular: Atherosclerosis of thoracic aorta is noted without aneurysm or dissection. No pericardial effusion is noted. Mediastinum/Nodes: No enlarged mediastinal, hilar, or axillary lymph nodes. Thyroid gland, trachea, and esophagus demonstrate no significant findings. Lungs/Pleura: Lungs are clear. No pleural effusion or pneumothorax. Musculoskeletal: No chest wall mass or suspicious bone lesions identified. CT ABDOMEN PELVIS FINDINGS Hepatobiliary: No focal liver abnormality is seen. No gallstones, gallbladder wall thickening, or biliary dilatation. Pancreas: Unremarkable. No pancreatic ductal dilatation or surrounding inflammatory changes. Spleen: Normal in size without focal abnormality. Adrenals/Urinary Tract: Adrenal glands are unremarkable. Kidneys are normal, without renal calculi, focal lesion, or hydronephrosis. Bladder is unremarkable. Stomach/Bowel: Stomach is within normal limits. Appendix appears normal. No evidence of bowel wall thickening, distention, or  inflammatory changes. Sigmoid diverticulosis without inflammation. Vascular/Lymphatic: Aortic atherosclerosis. No enlarged abdominal or pelvic lymph nodes. Reproductive: Uterus and bilateral adnexa are unremarkable. Other: No abdominal wall hernia or abnormality. No abdominopelvic ascites. Musculoskeletal: No acute or significant osseous findings. IMPRESSION: No evidence of traumatic injury seen in the chest, abdomen or pelvis. Sigmoid diverticulosis without inflammation. Aortic Atherosclerosis (ICD10-I70.0). Electronically Signed   By: Lupita Raider, M.D.   On: 04/20/2018 10:51   Ct Cervical Spine Wo Contrast  Result Date: 04/20/2018 CLINICAL DATA:  Found at the bottom of 20 steps by her husband this morning, uncertain of injury, nonverbal since accident, prior fall resulting in occipital injury, history of hypertension and  stroke EXAM: CT HEAD WITHOUT CONTRAST CT CERVICAL SPINE WITHOUT CONTRAST TECHNIQUE: Multidetector CT imaging of the head and cervical spine was performed following the standard protocol without intravenous contrast. Multiplanar CT image reconstructions of the cervical spine were also generated. COMPARISON:  None Correlation MR brain 11/27/2016 FINDINGS: CT HEAD FINDINGS Brain: Mild generalized atrophy. Normal ventricular morphology. No midline shift or mass effect. Small vessel chronic ischemic changes of deep cerebral white matter. Small scattered falcine calcifications. Small vessel chronic ischemic changes of deep cerebral white matter, mild. No intracranial hemorrhage, mass lesion, or evidence of acute infarction. No extra-axial fluid collections. Vascular: Unremarkable Skull: Intact. Post RIGHT parietal scalp hematoma and laceration with skin clips. Sinuses/Orbits: Clear Other: N/A CT CERVICAL SPINE FINDINGS Alignment: Minimal anterolisthesis at C4-C5 likely degenerative. Remaining alignments normal. Skull base and vertebrae: Mild osseous demineralization. Visualized skull base intact. Multilevel facet degenerative changes. Disc space narrowing and endplate spur formation throughout cervical spine greatest at C5-C6. Vertebral body heights maintained without fracture or bone destruction. Soft tissues and spinal canal: Prevertebral soft tissues normal thickness. Remaining cervical soft tissues unremarkable. Disc levels: AP narrowing of spinal canal LEFT of midline at C5-C6 due to endplate spur formation asymmetrically. Similar though less severe AP narrowing at C4-C5. Upper chest: Lung apices clear Other: N/A IMPRESSION: Atrophy with minimal small vessel chronic ischemic changes of deep cerebral white matter. No acute intracranial abnormalities. Posterior RIGHT parietal scalp hematoma and laceration. Scattered degenerative disc and facet disease changes of the cervical spine. No acute cervical spine  abnormalities. Electronically Signed   By: Ulyses Southward M.D.   On: 04/20/2018 09:22   Ct Abdomen Pelvis W Contrast  Result Date: 04/20/2018 CLINICAL DATA:  Fall down stairs. EXAM: CT CHEST, ABDOMEN, AND PELVIS WITH CONTRAST TECHNIQUE: Multidetector CT imaging of the chest, abdomen and pelvis was performed following the standard protocol during bolus administration of intravenous contrast. CONTRAST:  ISOVUE-300 IOPAMIDOL (ISOVUE-300) INJECTION 61% COMPARISON:  Radiograph of same day. FINDINGS: CT CHEST FINDINGS Cardiovascular: Atherosclerosis of thoracic aorta is noted without aneurysm or dissection. No pericardial effusion is noted. Mediastinum/Nodes: No enlarged mediastinal, hilar, or axillary lymph nodes. Thyroid gland, trachea, and esophagus demonstrate no significant findings. Lungs/Pleura: Lungs are clear. No pleural effusion or pneumothorax. Musculoskeletal: No chest wall mass or suspicious bone lesions identified. CT ABDOMEN PELVIS FINDINGS Hepatobiliary: No focal liver abnormality is seen. No gallstones, gallbladder wall thickening, or biliary dilatation. Pancreas: Unremarkable. No pancreatic ductal dilatation or surrounding inflammatory changes. Spleen: Normal in size without focal abnormality. Adrenals/Urinary Tract: Adrenal glands are unremarkable. Kidneys are normal, without renal calculi, focal lesion, or hydronephrosis. Bladder is unremarkable. Stomach/Bowel: Stomach is within normal limits. Appendix appears normal. No evidence of bowel wall thickening, distention, or inflammatory changes. Sigmoid diverticulosis without inflammation. Vascular/Lymphatic: Aortic atherosclerosis. No enlarged abdominal or pelvic lymph  nodes. Reproductive: Uterus and bilateral adnexa are unremarkable. Other: No abdominal wall hernia or abnormality. No abdominopelvic ascites. Musculoskeletal: No acute or significant osseous findings. IMPRESSION: No evidence of traumatic injury seen in the chest, abdomen or pelvis.  Sigmoid diverticulosis without inflammation. Aortic Atherosclerosis (ICD10-I70.0). Electronically Signed   By: Lupita RaiderJames  Green Jr, M.D.   On: 04/20/2018 10:51   Dg Pelvis Portable  Result Date: 04/20/2018 CLINICAL DATA:  Pain following fall EXAM: PORTABLE PELVIS 1-2 VIEWS COMPARISON:  None. FINDINGS: There is no evidence of pelvic fracture or dislocation. There is mild symmetric narrowing of each hip joint. No erosive change. IMPRESSION: No fracture or dislocation. Slight symmetric narrowing of each hip joint. Electronically Signed   By: Bretta BangWilliam  Woodruff III M.D.   On: 04/20/2018 09:27   Dg Chest Portable 1 View  Result Date: 04/20/2018 CLINICAL DATA:  Pain following fall EXAM: PORTABLE CHEST 1 VIEW COMPARISON:  None. FINDINGS: Lungs are clear. Heart is upper normal in size with pulmonary vascularity within normal limits. No adenopathy. No pneumothorax. No bone lesions. IMPRESSION: No edema or consolidation.  No pneumothorax. Electronically Signed   By: Bretta BangWilliam  Woodruff III M.D.   On: 04/20/2018 09:28   Assessment: 73 y.o female with past medical history of primary progressive aphasia following with [facility], hypertension, CVA, thyroid disease, depression with suicidal ideation, and recurrent falls presenting to the ED on 04/20/2018 following a traumatic fall with laceration to the back of the head. Unclear cause of fall. Patient with hx of PPA and unable to provide further information. No focal neurological deficit noted on exam. CT head reviewed and shows posterior right parietal scalp hematoma and laceration.  CT cervical spine was negative for acute fractures or abnormality.  Labs unremarkable with no abnormal findings noted thus far. Differentials include possible mechanical fall due to confusion and cognitive impairment in a patient with hx of PPA,  postural hypotension, central nervous system disorder, syncope, drop attacks, epilepsy or stroke. Have low suspicion for ischemic event or seizure  given the absence of focal neurological deficit in which resolution would be delayed.  Plan: 1. MRI of the brain pending for further evaluation. 2. Check orthostatics 3. Frequent neuro checks 4. Telemetry monitoring   Addendum: MRI no acute abnormalities.  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   [facility]

## 2018-04-21 NOTE — Care Management Note (Signed)
Case Management Note  Patient Details  Name: Leah Jacobs MRN: [id number] Date of Birth: 1945-12-04  Subjective/Objective:   RNCM met with family upon their request to provide community resources and possibly discuss home health. Patient worked with PT today and was unable to move her feet or get out of the bed. Patient has neurological deficits at baseline but per spouse she was able to ambulate with her walker and uses a shower chair. They currently have a private care sitter in the home that comes several times per week. Husband seems agreeable to home health but PT is now recommending SNF so disposition planning is unknown for now. CMS Medicare.gov Compare Post Acute Care list provided. Also provided additional list of private are services in the area.                   Action/Plan: RNCM to continue to follow for transition of care   Expected Discharge Date:                  Expected Discharge Plan:     In-House Referral:     Discharge planning Services  CM Consult  Post Acute Care Choice:    Choice offered to:     DME Arranged:    DME Agency:     HH Arranged:    HH Agency:     Status of Service:     If discussed at [employer] [facility] [employer], dates discussed:    Additional Comments:  Latanya Maudlin, RN 04/21/2018, 11:21 AM

## 2018-04-21 NOTE — Evaluation (Signed)
Physical Therapy Evaluation Patient Details Name: Leah Jacobs MRN: [id number] DOB: 27-Oct-1945 Today's Date: 04/21/2018   History of Present Illness  Patient is a 73 year old female admitted from home after fall. Laceration to back of head. PMH to include: apahsia, HTN, Hypothyroidism.   Clinical Impression  Patient received in bed with husband present. Patient alert, but does not speak or follow commands for mobility at this time. Patient requires total assist for bed mobility and transfer. Once assisted to standing patient has fair balance with RW, but is unable to move feet despite tactile and verbal cues.  Patient will benefit from continued PT to address her functional limitations.      Follow Up Recommendations SNF    Equipment Recommendations  None recommended by PT    Recommendations for Other Services       Precautions / Restrictions Precautions Precautions: Fall Restrictions Weight Bearing Restrictions: No      Mobility  Bed Mobility Overal bed mobility: Needs Assistance Bed Mobility: Supine to Sit;Sit to Supine     Supine to sit: Total assist Sit to supine: +2 for physical assistance   General bed mobility comments: patient did not attempt to move toward edge of be to attempt sitting up when directed.   Transfers Overall transfer level: Needs assistance Equipment used: Rolling walker (2 wheeled) Transfers: Sit to/from Stand              Ambulation/Gait Ambulation/Gait assistance: +2 physical assistance           General Gait Details: patient unable to walk once standing. Unable to move feet at all.    Stairs            Wheelchair Mobility    Modified Rankin (Stroke Patients Only)       Balance Overall balance assessment: Modified Independent                                           Pertinent Vitals/Pain Pain Assessment: No/denies pain    Home Living Family/patient expects to be discharged to:: Private  residence Living Arrangements: Spouse/significant other Available Help at Discharge: Available 24 hours/day Type of Home: House         Home Equipment: Walker - 2 wheels      Prior Function Level of Independence: Needs assistance;Independent with assistive device(s)   Gait / Transfers Assistance Needed: requires assistance for safety only. Spouse reports she has had multiple falls.   ADL's / Homemaking Assistance Needed: independent        Hand Dominance        Extremity/Trunk Assessment   Upper Extremity Assessment Upper Extremity Assessment: Difficult to assess due to impaired cognition    Lower Extremity Assessment Lower Extremity Assessment: Difficult to assess due to impaired cognition    Cervical / Trunk Assessment Cervical / Trunk Assessment: Normal  Communication   Communication: Expressive difficulties  Cognition Arousal/Alertness: Awake/alert Behavior During Therapy: Flat affect Overall Cognitive Status: Impaired/Different from baseline Area of Impairment: Attention;Following commands;Awareness                       Following Commands: (not following commands)       General Comments: patient alert, but does not respond to me, does not follow commands      General Comments      Exercises     Assessment/Plan  PT Assessment Patient needs continued PT services  PT Problem List Decreased mobility;Decreased activity tolerance;Decreased safety awareness;Decreased cognition       PT Treatment Interventions DME instruction;Functional mobility training;Balance training;Patient/family education;Gait training;Therapeutic activities;Neuromuscular re-education;Stair training;Therapeutic exercise    PT Goals (Current goals can be found in the Care Plan section)  Acute Rehab PT Goals Patient Stated Goal: to return home if able PT Goal Formulation: Patient unable to participate in goal setting Time For Goal Achievement: 05/05/18 Potential to  Achieve Goals: Fair    Frequency Min 2X/week   Barriers to discharge Decreased caregiver support      Co-evaluation               AM-PAC PT "6 Clicks" Mobility  Outcome Measure Help needed turning from your back to your side while in a flat bed without using bedrails?: Total Help needed moving from lying on your back to sitting on the side of a flat bed without using bedrails?: Total Help needed moving to and from a bed to a chair (including a wheelchair)?: Total Help needed standing up from a chair using your arms (e.g., wheelchair or bedside chair)?: Total Help needed to walk in hospital room?: Total Help needed climbing 3-5 steps with a railing? : Total 6 Click Score: 6    End of Session Equipment Utilized During Treatment: Gait belt Activity Tolerance: Patient tolerated treatment well Patient left: in bed;with family/visitor present Nurse Communication: Mobility status PT Visit Diagnosis: Unsteadiness on feet (R26.81);Muscle weakness (generalized) (M62.81);Difficulty in walking, not elsewhere classified (R26.2);Repeated falls (R29.6)    Time: 1030-1100 PT Time Calculation (min) (ACUTE ONLY): 30 min   Charges:   PT Evaluation $PT Eval Moderate Complexity: 1 Mod PT Treatments $Therapeutic Activity: 23-37 mins        Brighid Koch, PT, GCS 04/21/18,11:20 AM

## 2018-04-21 NOTE — Progress Notes (Signed)
[facility]   PATIENT NAME: Leah Jacobs    MR#:  [id number]  DATE OF BIRTH:  14-Feb-1946  SUBJECTIVE:  CHIEF COMPLAINT: Patient says few words still following commands but according to the husband there is a significant decline in her mental status Agreeable with MRI of the brain and neurology/psychiatry consult  REVIEW OF SYSTEMS:  Review of system unobtainable as the patient is altered  DRUG ALLERGIES:   Allergies  Allergen Reactions  . Sulfa Antibiotics Hives  . Gadolinium Derivatives     Unknown; pt does not remember reaction  . Neosporin [Neomycin-Bacitracin Zn-Polymyx] Rash    VITALS:  Blood pressure (!) 135/58, pulse 79, temperature 98.2 F (36.8 C), temperature source Oral, resp. rate 16, height 5\' 4"  (1.626 m), weight 68 kg, SpO2 98 %.  PHYSICAL EXAMINATION:  GENERAL:  73 y.o.-year-old patient lying in the bed with no acute distress.  EYES: Pupils equal, round, reactive to light and accommodation. No scleral icterus. Extraocular muscles intact.  HEENT: Head atraumatic, normocephalic. Oropharynx and nasopharynx clear.  NECK:  Supple, no jugular venous distention. No thyroid enlargement, no tenderness.  LUNGS: Normal breath sounds bilaterally, no wheezing, rales,rhonchi or crepitation. No use of accessory muscles of respiration.  CARDIOVASCULAR: S1, S2 normal. No murmurs, rubs, or gallops.  ABDOMEN: Soft, nontender, nondistended. Bowel sounds present. No organomegaly or mass.  EXTREMITIES: No pedal edema, cyanosis, or clubbing.  NEUROLOGIC: Awake and alert, follows verbal command sensation intact. Gait not checked.  PSYCHIATRIC: The patient is alert and oriented x 2-3.  SKIN: No obvious rash, lesion, or ulcer.    LABORATORY PANEL:   CBC Recent Labs  Lab 04/20/18 0915  WBC 8.2  HGB 12.5  HCT 37.5  PLT 257    ------------------------------------------------------------------------------------------------------------------  Chemistries  Recent Labs  Lab 04/20/18 0915  NA 135  K 3.7  CL 104  CO2 23  GLUCOSE 188*  BUN 20  CREATININE 0.68  CALCIUM 8.5*  AST 34  ALT 30  ALKPHOS 50  BILITOT 0.8   ------------------------------------------------------------------------------------------------------------------  Cardiac Enzymes Recent Labs  Lab 04/20/18 0915  TROPONINI <0.03   ------------------------------------------------------------------------------------------------------------------  RADIOLOGY:  Ct Head Wo Contrast  Result Date: 04/20/2018 CLINICAL DATA:  Found at the bottom of 20 steps by her husband this morning, uncertain of injury, nonverbal since accident, prior fall resulting in occipital injury, history of hypertension and stroke EXAM: CT HEAD WITHOUT CONTRAST CT CERVICAL SPINE WITHOUT CONTRAST TECHNIQUE: Multidetector CT imaging of the head and cervical spine was performed following the standard protocol without intravenous contrast. Multiplanar CT image reconstructions of the cervical spine were also generated. COMPARISON:  None Correlation MR brain 11/27/2016 FINDINGS: CT HEAD FINDINGS Brain: Mild generalized atrophy. Normal ventricular morphology. No midline shift or mass effect. Small vessel chronic ischemic changes of deep cerebral white matter. Small scattered falcine calcifications. Small vessel chronic ischemic changes of deep cerebral white matter, mild. No intracranial hemorrhage, mass lesion, or evidence of acute infarction. No extra-axial fluid collections. Vascular: Unremarkable Skull: Intact. Post RIGHT parietal scalp hematoma and laceration with skin clips. Sinuses/Orbits: Clear Other: N/A CT CERVICAL SPINE FINDINGS Alignment: Minimal anterolisthesis at C4-C5 likely degenerative. Remaining alignments normal. Skull base and vertebrae: Mild osseous demineralization.  Visualized skull base intact. Multilevel facet degenerative changes. Disc space narrowing and endplate spur formation throughout cervical spine greatest at C5-C6. Vertebral body heights maintained without fracture or bone destruction. Soft tissues and spinal canal: Prevertebral soft tissues normal thickness. Remaining cervical soft tissues  unremarkable. Disc levels: AP narrowing of spinal canal LEFT of midline at C5-C6 due to endplate spur formation asymmetrically. Similar though less severe AP narrowing at C4-C5. Upper chest: Lung apices clear Other: N/A IMPRESSION: Atrophy with minimal small vessel chronic ischemic changes of deep cerebral white matter. No acute intracranial abnormalities. Posterior RIGHT parietal scalp hematoma and laceration. Scattered degenerative disc and facet disease changes of the cervical spine. No acute cervical spine abnormalities. Electronically Signed   By: Ulyses Southward M.D.   On: 04/20/2018 09:22   Ct Chest W Contrast  Result Date: 04/20/2018 CLINICAL DATA:  Fall down stairs. EXAM: CT CHEST, ABDOMEN, AND PELVIS WITH CONTRAST TECHNIQUE: Multidetector CT imaging of the chest, abdomen and pelvis was performed following the standard protocol during bolus administration of intravenous contrast. CONTRAST:  ISOVUE-300 IOPAMIDOL (ISOVUE-300) INJECTION 61% COMPARISON:  Radiograph of same day. FINDINGS: CT CHEST FINDINGS Cardiovascular: Atherosclerosis of thoracic aorta is noted without aneurysm or dissection. No pericardial effusion is noted. Mediastinum/Nodes: No enlarged mediastinal, hilar, or axillary lymph nodes. Thyroid gland, trachea, and esophagus demonstrate no significant findings. Lungs/Pleura: Lungs are clear. No pleural effusion or pneumothorax. Musculoskeletal: No chest wall mass or suspicious bone lesions identified. CT ABDOMEN PELVIS FINDINGS Hepatobiliary: No focal liver abnormality is seen. No gallstones, gallbladder wall thickening, or biliary dilatation. Pancreas:  Unremarkable. No pancreatic ductal dilatation or surrounding inflammatory changes. Spleen: Normal in size without focal abnormality. Adrenals/Urinary Tract: Adrenal glands are unremarkable. Kidneys are normal, without renal calculi, focal lesion, or hydronephrosis. Bladder is unremarkable. Stomach/Bowel: Stomach is within normal limits. Appendix appears normal. No evidence of bowel wall thickening, distention, or inflammatory changes. Sigmoid diverticulosis without inflammation. Vascular/Lymphatic: Aortic atherosclerosis. No enlarged abdominal or pelvic lymph nodes. Reproductive: Uterus and bilateral adnexa are unremarkable. Other: No abdominal wall hernia or abnormality. No abdominopelvic ascites. Musculoskeletal: No acute or significant osseous findings. IMPRESSION: No evidence of traumatic injury seen in the chest, abdomen or pelvis. Sigmoid diverticulosis without inflammation. Aortic Atherosclerosis (ICD10-I70.0). Electronically Signed   By: Lupita Raider, M.D.   On: 04/20/2018 10:51   Ct Cervical Spine Wo Contrast  Result Date: 04/20/2018 CLINICAL DATA:  Found at the bottom of 20 steps by her husband this morning, uncertain of injury, nonverbal since accident, prior fall resulting in occipital injury, history of hypertension and stroke EXAM: CT HEAD WITHOUT CONTRAST CT CERVICAL SPINE WITHOUT CONTRAST TECHNIQUE: Multidetector CT imaging of the head and cervical spine was performed following the standard protocol without intravenous contrast. Multiplanar CT image reconstructions of the cervical spine were also generated. COMPARISON:  None Correlation MR brain 11/27/2016 FINDINGS: CT HEAD FINDINGS Brain: Mild generalized atrophy. Normal ventricular morphology. No midline shift or mass effect. Small vessel chronic ischemic changes of deep cerebral white matter. Small scattered falcine calcifications. Small vessel chronic ischemic changes of deep cerebral white matter, mild. No intracranial hemorrhage, mass  lesion, or evidence of acute infarction. No extra-axial fluid collections. Vascular: Unremarkable Skull: Intact. Post RIGHT parietal scalp hematoma and laceration with skin clips. Sinuses/Orbits: Clear Other: N/A CT CERVICAL SPINE FINDINGS Alignment: Minimal anterolisthesis at C4-C5 likely degenerative. Remaining alignments normal. Skull base and vertebrae: Mild osseous demineralization. Visualized skull base intact. Multilevel facet degenerative changes. Disc space narrowing and endplate spur formation throughout cervical spine greatest at C5-C6. Vertebral body heights maintained without fracture or bone destruction. Soft tissues and spinal canal: Prevertebral soft tissues normal thickness. Remaining cervical soft tissues unremarkable. Disc levels: AP narrowing of spinal canal LEFT of midline at C5-C6 due to endplate  spur formation asymmetrically. Similar though less severe AP narrowing at C4-C5. Upper chest: Lung apices clear Other: N/A IMPRESSION: Atrophy with minimal small vessel chronic ischemic changes of deep cerebral white matter. No acute intracranial abnormalities. Posterior RIGHT parietal scalp hematoma and laceration. Scattered degenerative disc and facet disease changes of the cervical spine. No acute cervical spine abnormalities. Electronically Signed   By: Ulyses Southward M.D.   On: 04/20/2018 09:22   Ct Abdomen Pelvis W Contrast  Result Date: 04/20/2018 CLINICAL DATA:  Fall down stairs. EXAM: CT CHEST, ABDOMEN, AND PELVIS WITH CONTRAST TECHNIQUE: Multidetector CT imaging of the chest, abdomen and pelvis was performed following the standard protocol during bolus administration of intravenous contrast. CONTRAST:  ISOVUE-300 IOPAMIDOL (ISOVUE-300) INJECTION 61% COMPARISON:  Radiograph of same day. FINDINGS: CT CHEST FINDINGS Cardiovascular: Atherosclerosis of thoracic aorta is noted without aneurysm or dissection. No pericardial effusion is noted. Mediastinum/Nodes: No enlarged mediastinal, hilar,  or axillary lymph nodes. Thyroid gland, trachea, and esophagus demonstrate no significant findings. Lungs/Pleura: Lungs are clear. No pleural effusion or pneumothorax. Musculoskeletal: No chest wall mass or suspicious bone lesions identified. CT ABDOMEN PELVIS FINDINGS Hepatobiliary: No focal liver abnormality is seen. No gallstones, gallbladder wall thickening, or biliary dilatation. Pancreas: Unremarkable. No pancreatic ductal dilatation or surrounding inflammatory changes. Spleen: Normal in size without focal abnormality. Adrenals/Urinary Tract: Adrenal glands are unremarkable. Kidneys are normal, without renal calculi, focal lesion, or hydronephrosis. Bladder is unremarkable. Stomach/Bowel: Stomach is within normal limits. Appendix appears normal. No evidence of bowel wall thickening, distention, or inflammatory changes. Sigmoid diverticulosis without inflammation. Vascular/Lymphatic: Aortic atherosclerosis. No enlarged abdominal or pelvic lymph nodes. Reproductive: Uterus and bilateral adnexa are unremarkable. Other: No abdominal wall hernia or abnormality. No abdominopelvic ascites. Musculoskeletal: No acute or significant osseous findings. IMPRESSION: No evidence of traumatic injury seen in the chest, abdomen or pelvis. Sigmoid diverticulosis without inflammation. Aortic Atherosclerosis (ICD10-I70.0). Electronically Signed   By: Lupita Raider, M.D.   On: 04/20/2018 10:51   Dg Pelvis Portable  Result Date: 04/20/2018 CLINICAL DATA:  Pain following fall EXAM: PORTABLE PELVIS 1-2 VIEWS COMPARISON:  None. FINDINGS: There is no evidence of pelvic fracture or dislocation. There is mild symmetric narrowing of each hip joint. No erosive change. IMPRESSION: No fracture or dislocation. Slight symmetric narrowing of each hip joint. Electronically Signed   By: Bretta Bang III M.D.   On: 04/20/2018 09:27   Dg Chest Portable 1 View  Result Date: 04/20/2018 CLINICAL DATA:  Pain following fall EXAM: PORTABLE  CHEST 1 VIEW COMPARISON:  None. FINDINGS: Lungs are clear. Heart is upper normal in size with pulmonary vascularity within normal limits. No adenopathy. No pneumothorax. No bone lesions. IMPRESSION: No edema or consolidation.  No pneumothorax. Electronically Signed   By: Bretta Bang III M.D.   On: 04/20/2018 09:28    EKG:   Orders placed or performed during the hospital encounter of 04/20/18  . ED EKG  . ED EKG  . EKG 12-Lead  . EKG 12-Lead  . EKG 12-Lead  . EKG 12-Lead    ASSESSMENT AND PLAN:   Leah Jacobs  is a 73 y.o. female with a known history of hypertension, hypothyroidism, progressive aphasia followed by [facility] neurology with etiology unclear comes to the emergency room after her husband heard a loud thump this morning and found patient at the bottom of a flight of stairs. Sure how and at what steps patient slipped and fell.  1.   Altered mental status - s/p right  posterior scalp hematoma status post-unwitnessed fall fall at home flight of stairs. -MRI of the brain ordered which is pending -Neurology consult placed discussed with PA Ouma -Patient unable to give history given progressive aphasia -CT had negative for stroke. Shows hematoma right parietal scalp area sutured by ER physician -continue to monitor neuro- status -physical therapy -recommending skilled nursing facility -[facility] -is following  2. Hypertension continue home meds -blood pressure has been stable  3. Hypothyroidism continue Synthroid  4. History of progressive aphasia etiology unknown. Patient follows with [facility] neurology-unclear etiology of the progressive aphasia  5.  History of depression with suicidal ideation Psychiatry consult placed  5. DVT prophylaxis SCD   Disposition skilled nursing facility  All the records are reviewed and case discussed with Care Management/Social Workerr. Management plans discussed with the patient, family and they are in agreement.  CODE STATUS: Full  code  TOTAL TIME TAKING CARE OF THIS PATIENT: 36 minutes.   POSSIBLE D/C IN 2  DAYS, DEPENDING ON CLINICAL CONDITION.  Note: This dictation was prepared with Dragon dictation along with smaller phrase technology. Any transcriptional errors that result from this process are unintentional.   Leah Jacobs M.D on 04/21/2018 at 1:37 PM  Between 7am to 6pm - Pager - [phone] After 6pm go to [email or web address] - password [email or web address] [facility]  [facility]  Office  [phone]  CC: Primary care physician; [place]n [place], [facility]

## 2018-04-21 NOTE — Consult Note (Signed)
[facility] Face-to-Face Psychiatry Consult   Reason for Consult:  Unwitnessed Fall 04/20/2018, with mental status changes Referring Physician:  Dr. Amado Coe Patient Identification: Shaiel Pelz MRN:  [id number] Principal Diagnosis: Scalp hematoma, initial encounter Diagnosis:  Principal Problem:   Scalp hematoma, initial encounter Active Problems:   Primary progressive aphasia (HCC)   Disequilibrium   Hypertension   Mixed hyperlipidemia   OSA (obstructive sleep apnea)   Sensorineural hearing loss (SNHL) of both ears   Total Time spent with patient: 30 minutes  Subjective:  Unable to communicate Patient is seen, chart is reviewed.  Unable to obtain collateral from husband at this point.  HPI: Torrey Pons is a 73 y.o. female patient admitted with  known history of hypertension, hypothyroidism, progressive aphasia followed by [facility] with etiology unclear comes to the emergency room after her husband heard a loud thump this morning and found patient at the bottom of a flight of stairs. Sure how and at what steps patient slipped and fell. Brought to the emergency room she is hemodynamically stable she is awake alert. She has a right parietal scalp hematoma that was sutured by the ER physician. CAT scan of the head negative for stroke. Shows soft tissue swelling with scalp hematoma on the right parietal area. CT of the abdomen and pelvis negative for trauma patient otherwise is independent and does her ADLs by herself according to the husband. She has been having intermittent weakness and falls but no major trauma.  Psychiatry evaluate patient as requested to assist with behaviors.  Per records, patient had a visit to the emergency room in April 02, 2018 for suicidal ideation with thoughts of killing herself with a gun.  There are guns in the house, and patient was able to find them.  Patient has been have increasing depression since her diagnosis of primary progressive aphasia.  Patient  for medication review is on venlafaxine.  During evaluation, patient is found attempting to get out of her bed.  She is anxious and agitated.  She stares, and appears to have receptive language ability.  She does respond to yes/no questions seemingly appropriately.  She exhibits expressive aphasia with questions that require syntax.  Patient does follow commands to get back to bed, however she shortly thereafter attempts to get out of bed again.  Past Psychiatric History: Depression  Risk to Self:  Potential Risk to Others:  Potential Prior Inpatient Therapy:  None Prior Outpatient Therapy:  None  Past Medical History:  Past Medical History:  Diagnosis Date  . Hypertension   . Stroke (HCC)   . Thyroid disease     Past Surgical History:  Procedure Laterality Date  . TONSILLECTOMY     Family History: History reviewed. No pertinent family history. Family Psychiatric  History: None  Social History:  Social History   Substance and Sexual Activity  Alcohol Use Yes     Social History   Substance and Sexual Activity  Drug Use No    Social History   Socioeconomic History  . Marital status: Married    Spouse name: Not on file  . Number of children: Not on file  . Years of education: Not on file  . Highest education level: Not on file  Occupational History  . Not on file  Social Needs  . Financial resource strain: Not on file  . Food insecurity:    Worry: Not on file    Inability: Not on file  . Transportation needs:    Medical: Not  on file    Non-medical: Not on file  Tobacco Use  . Smoking status: Never Smoker  . Smokeless tobacco: Never Used  Substance and Sexual Activity  . Alcohol use: Yes  . Drug use: No  . Sexual activity: Not on file  Lifestyle  . Physical activity:    Days per week: Not on file    Minutes per session: Not on file  . Stress: Not on file  Relationships  . Social connections:    Talks on phone: Not on file    Gets together: Not on file     Attends religious service: Not on file    Active member of club or organization: Not on file    Attends meetings of clubs or organizations: Not on file    Relationship status: Not on file  Other Topics Concern  . Not on file  Social History Narrative  . Not on file   Additional Social History:  Lives with husband.  Weapons have been secured since patient expressed suicidal ideation in January 2020.  Allergies:   Allergies  Allergen Reactions  . Sulfa Antibiotics Hives  . Gadolinium Derivatives     Unknown; pt does not remember reaction  . Neosporin [Neomycin-Bacitracin Zn-Polymyx] Rash    Labs:  Results for orders placed or performed during the hospital encounter of 04/20/18 (from the past 48 hour(s))  CBC with Differential/Platelet     Status: Abnormal   Collection Time: 04/20/18  9:15 AM  Result Value Ref Range   WBC 8.2 4.0 - 10.5 K/uL   RBC 4.18 3.87 - 5.11 MIL/uL   Hemoglobin 12.5 12.0 - 15.0 g/dL   HCT 16.137.5 09.636.0 - 04.546.0 %   MCV 89.7 80.0 - 100.0 fL   MCH 29.9 26.0 - 34.0 pg   MCHC 33.3 30.0 - 36.0 g/dL   RDW 40.913.2 81.111.5 - 91.415.5 %   Platelets 257 150 - 400 K/uL   nRBC 0.0 0.0 - 0.2 %   Neutrophils Relative % 55 %   Neutro Abs 4.7 1.7 - 7.7 K/uL   Lymphocytes Relative 26 %   Lymphs Abs 2.1 0.7 - 4.0 K/uL   Monocytes Relative 9 %   Monocytes Absolute 0.7 0.1 - 1.0 K/uL   Eosinophils Relative 8 %   Eosinophils Absolute 0.7 (H) 0.0 - 0.5 K/uL   Basophils Relative 1 %   Basophils Absolute 0.1 0.0 - 0.1 K/uL   Immature Granulocytes 1 %   Abs Immature Granulocytes 0.04 0.00 - 0.07 K/uL    Comment: Performed at [facility], [address] [place], [place] [address]  Comprehensive metabolic panel     Status: Abnormal   Collection Time: 04/20/18  9:15 AM  Result Value Ref Range   Sodium 135 135 - 145 mmol/L   Potassium 3.7 3.5 - 5.1 mmol/L   Chloride 104 98 - 111 mmol/L   CO2 23 22 - 32 mmol/L   Glucose, Bld 188 (H) 70 - 99 mg/dL   BUN 20 8 - 23 mg/dL    Creatinine, Ser 6.210.68 0.44 - 1.00 mg/dL   Calcium 8.5 (L) 8.9 - 10.3 mg/dL   Total Protein 7.1 6.5 - 8.1 g/dL   Albumin 4.2 3.5 - 5.0 g/dL   AST 34 15 - 41 U/L   ALT 30 0 - 44 U/L   Alkaline Phosphatase 50 38 - 126 U/L   Total Bilirubin 0.8 0.3 - 1.2 mg/dL   GFR calc non Af Amer >60 >60  mL/min   GFR calc Af Amer >60 >60 mL/min   Anion gap 8 5 - 15    Comment: Performed at [facility], [address] [place], [place] [address]  Type and screen [facility]     Status: None   Collection Time: 04/20/18  9:15 AM  Result Value Ref Range   ABO/RH(D) A NEG    Antibody Screen NEG    Sample Expiration      04/23/2018 Performed at [facility], [address] [place], [place] [address]   CK     Status: None   Collection Time: 04/20/18  9:15 AM  Result Value Ref Range   Total CK 131 38 - 234 U/L    Comment: Performed at [facility], [address] [place], [place] [address]  Troponin I - Add-On to previous collection     Status: None   Collection Time: 04/20/18  9:15 AM  Result Value Ref Range   Troponin I <0.03 <0.03 ng/mL    Comment: Performed at [facility], [address] [place], [place] [address]  Glucose, capillary     Status: Abnormal   Collection Time: 04/20/18  6:39 PM  Result Value Ref Range   Glucose-Capillary 106 (H) 70 - 99 mg/dL  Glucose, capillary     Status: Abnormal   Collection Time: 04/20/18  9:01 PM  Result Value Ref Range   Glucose-Capillary 115 (H) 70 - 99 mg/dL   Comment 1 Notify RN   Glucose, capillary     Status: Abnormal   Collection Time: 04/21/18  7:38 AM  Result Value Ref Range   Glucose-Capillary 128 (H) 70 - 99 mg/dL  Glucose, capillary     Status: None   Collection Time: 04/21/18 12:01 PM  Result Value Ref Range   Glucose-Capillary 99 70 - 99 mg/dL    Current Facility-Administered Medications  Medication Dose Route Frequency Provider Last Rate Last Dose  . acetaminophen  (TYLENOL) tablet 650 mg  650 mg Oral Q6H PRN Enedina Finner, MD       Or  . acetaminophen (TYLENOL) suppository 650 mg  650 mg Rectal Q6H PRN Enedina Finner, MD      . ALPRAZolam Prudy Feeler) tablet 0.5 mg  0.5 mg Oral Once PRN Gouru, Aruna, MD      . amLODipine (NORVASC) tablet 10 mg  10 mg Oral Daily Enedina Finner, MD   10 mg at 04/21/18 0848  . insulin aspart (novoLOG) injection 0-9 Units  0-9 Units Subcutaneous TID WC Enedina Finner, MD   1 Units at 04/21/18 0849  . levothyroxine (SYNTHROID, LEVOTHROID) tablet 75 mcg  75 mcg Oral Q0600 Enedina Finner, MD   75 mcg at 04/21/18 0502  . lisinopril (PRINIVIL,ZESTRIL) tablet 20 mg  20 mg Oral Daily Enedina Finner, MD   20 mg at 04/21/18 0848  . ondansetron (ZOFRAN) tablet 4 mg  4 mg Oral Q6H PRN Enedina Finner, MD       Or  . ondansetron [facility]) injection 4 mg  4 mg Intravenous Q6H PRN Enedina Finner, MD      . polyethylene glycol (MIRALAX / GLYCOLAX) packet 17 g  17 g Oral Daily PRN Enedina Finner, MD      . venlafaxine XR (EFFEXOR-XR) 24 hr capsule 75 mg  75 mg Oral Daily Enedina Finner, MD   75 mg at 04/21/18 9629    Musculoskeletal: Strength & Muscle Tone: within normal limits Gait & Station: unsteady Patient leans:  N/A  Psychiatric Specialty Exam: Physical Exam  Nursing note and vitals reviewed. Constitutional: She appears well-developed and well-nourished.  Respiratory: Effort normal. No respiratory distress.  Musculoskeletal: Normal range of motion.  Neurological: She is alert.    Review of Systems  Unable to perform ROS: Mental status change    Blood pressure (!) 135/58, pulse 79, temperature 98.2 F (36.8 C), temperature source Oral, resp. rate 16, height 5\' 4"  (1.626 m), weight 68 kg, SpO2 98 %.Body mass index is 25.75 kg/m.  General Appearance: Disheveled  Eye Contact:  stares  Speech:  Garbled  Volume:  Decreased  Mood:  Anxious  Affect:  Congruent and Inappropriate  Thought Process:  NA  Orientation:  NA  Thought Content:  NA  Suicidal Thoughts:   Patient does not answer  Homicidal Thoughts:  Patient does not answer  Memory:  NA  Judgement:  NA  Insight:  NA  Psychomotor Activity:  NA  Concentration:  Attention Span: Poor  Recall:  NA  Fund of Knowledge:  NA  Language:  Poor  Akathisia:  No  Handed:  Right  AIMS (if indicated):   0  Assets:  Social Support  ADL's:  Impaired  Cognition:  Impaired,  Moderate  Sleep:   Fluctuating since hospitalized     Treatment Plan Summary: Daily contact with patient to assess and evaluate symptoms and progress in treatment Add UA and TSH to rule out underlying medical condition which could be contributing to worsening of progressive aphasia.  No specific medication recommendations at this time.  Continue to observe the patient and rule out underlying causes of delirium.  If patient continues to have behavioral issues, could consider low-dose second generation antipsychotics.  Will wait to have discussion with patient's husband for shared decision making prior to starting this medication given black box warnings.  Disposition: Patient does not meet criteria for psychiatric inpatient admission. Requires medical clearance.  Mariel Craft, MD 04/21/2018 12:58 PM

## 2018-04-22 LAB — URINALYSIS, ROUTINE W REFLEX MICROSCOPIC
Bilirubin Urine: NEGATIVE
Glucose, UA: NEGATIVE mg/dL
Ketones, ur: 5 mg/dL — AB
Nitrite: NEGATIVE
Protein, ur: NEGATIVE mg/dL
SPECIFIC GRAVITY, URINE: 1.018 (ref 1.005–1.030)
pH: 6 (ref 5.0–8.0)

## 2018-04-22 LAB — GLUCOSE, CAPILLARY
Glucose-Capillary: 109 mg/dL — ABNORMAL HIGH (ref 70–99)
Glucose-Capillary: 94 mg/dL (ref 70–99)
Glucose-Capillary: 128 mg/dL — ABNORMAL HIGH (ref 70–99)
Glucose-Capillary: 104 mg/dL — ABNORMAL HIGH (ref 70–99)
Glucose-Capillary: 94 mg/dL (ref 70–99)

## 2018-04-22 MED ORDER — VENLAFAXINE HCL ER 75 MG PO CP24
150.0000 mg | ORAL_CAPSULE | Freq: Every day | ORAL | Status: DC
  Administered 2018-04-22 – 2018-04-24 (×3): 150 mg via ORAL
  Filled 2018-04-22 (×3): qty 2

## 2018-04-22 MED ORDER — LEVOTHYROXINE SODIUM 100 MCG PO TABS
100.0000 ug | ORAL_TABLET | Freq: Every day | ORAL | Status: DC
  Administered 2018-04-23 – 2018-04-24 (×2): 100 ug via ORAL
  Filled 2018-04-22 (×2): qty 1

## 2018-04-22 NOTE — Plan of Care (Addendum)
Pt to inpt now to go to snf on sat.improved.

## 2018-04-22 NOTE — Progress Notes (Signed)
[facility] [place] [facility]   PATIENT NAME: Leah Jacobs    MR#:  [id number]  DATE OF BIRTH:  09/06/1945  SUBJECTIVE:  CHIEF COMPLAINT: Patient is more alert today and more articular today than yesterday but still unable to get review of systems  REVIEW OF SYSTEMS:  Review of system unobtainable  DRUG ALLERGIES:   Allergies  Allergen Reactions  . Sulfa Antibiotics Hives  . Gadolinium Derivatives     Unknown; pt does not remember reaction  . Neosporin [Neomycin-Bacitracin Zn-Polymyx] Rash    VITALS:  Blood pressure 123/63, pulse (!) 52, temperature 97.7 F (36.5 C), temperature source Oral, resp. rate 18, height 5\' 4"  (1.626 m), weight 68 kg, SpO2 94 %.  PHYSICAL EXAMINATION:  GENERAL:  73 y.o.-year-old patient lying in the bed with no acute distress.  EYES: Pupils equal, round, reactive to light and accommodation. No scleral icterus. Extraocular muscles intact.  HEENT: Head atraumatic, normocephalic. Oropharynx and nasopharynx clear.  NECK:  Supple, no jugular venous distention. No thyroid enlargement, no tenderness.  LUNGS: Normal breath sounds bilaterally, no wheezing, rales,rhonchi or crepitation. No use of accessory muscles of respiration.  CARDIOVASCULAR: S1, S2 normal. No murmurs, rubs, or gallops.  ABDOMEN: Soft, nontender, nondistended. Bowel sounds present. No organomegaly or mass.  EXTREMITIES: No pedal edema, cyanosis, or clubbing.  NEUROLOGIC: Awake and alert, follows verbal command sensation intact. Gait not checked.  PSYCHIATRIC: The patient is alert and oriented x 2-3.  SKIN: No obvious rash, lesion, or ulcer.    LABORATORY PANEL:   CBC Recent Labs  Lab 04/20/18 0915  WBC 8.2  HGB 12.5  HCT 37.5  PLT 257   ------------------------------------------------------------------------------------------------------------------  Chemistries  Recent Labs  Lab 04/20/18 0915  NA 135  K 3.7  CL 104  CO2 23  GLUCOSE 188*   BUN 20  CREATININE 0.68  CALCIUM 8.5*  AST 34  ALT 30  ALKPHOS 50  BILITOT 0.8   ------------------------------------------------------------------------------------------------------------------  Cardiac Enzymes Recent Labs  Lab 04/20/18 0915  TROPONINI <0.03   ------------------------------------------------------------------------------------------------------------------  RADIOLOGY:  Mr Brain Wo Contrast  Result Date: 04/21/2018 CLINICAL DATA:  [age] year old female found at the bottom of the flight of stairs. Scalp hematoma. History of progressive aphasia, ataxia. EXAM: MRI HEAD WITHOUT CONTRAST TECHNIQUE: Multiplanar, multiecho pulse sequences of the brain and surrounding structures were obtained without intravenous contrast. COMPARISON:  Head and cervical spine CT earlier today. Brain MRI 11/27/2016. FINDINGS: Brain: No restricted diffusion to suggest acute infarction. No midline shift, mass effect, evidence of mass lesion, ventriculomegaly, extra-axial collection or acute intracranial hemorrhage. Cervicomedullary junction and pituitary are within normal limits. Scattered small cerebral white matter T2 and FLAIR hyperintense foci are stable since 2018, and mild for age. No cortical encephalomalacia. There are 2 small sub appended mole foci of chronic hemorrhage associated with the right lateral ventricle. No other chronic cerebral blood products. Deep gray matter nuclei, brainstem, and cerebellum are normal for age. Cerebral volume is stable since 2018. Vascular: Major intracranial vascular flow voids are stable since 2018. Skull and upper cervical spine: Negative visible cervical spine. Visualized bone marrow signal is within normal limits. Sinuses/Orbits: Stable and negative. Other: Mastoids are clear. Visible internal auditory structures appear normal. Right parietal convexity scalp hematoma with evidence of skin staples. Face soft tissues appear negative. IMPRESSION: 1.  No acute  intracranial abnormality. Stable since 2018 noncontrast MRI appearance of the brain with mild for age signal changes most commonly due to chronic small vessel disease.  2. Right scalp hematoma. Electronically Signed   By: Odessa Fleming M.D.   On: 04/21/2018 14:19    EKG:   Orders placed or performed during the hospital encounter of 04/20/18  . ED EKG  . ED EKG  . EKG 12-Lead  . EKG 12-Lead  . EKG 12-Lead  . EKG 12-Lead    ASSESSMENT AND PLAN:   Leah Jacobs  is a 73 y.o. female with a known history of hypertension, hypothyroidism, progressive aphasia followed by [facility] with etiology unclear comes to the emergency room after her husband heard a loud thump this morning and found patient at the bottom of a flight of stairs. Sure how and at what steps patient slipped and fell.  1.   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 -MRI of the brain -no acute intracranial abnormality -Neurology consult placed discussed with PA Ouma recommending to continue outpatient follow-up with [facility] -Patient unable to give history given progressive aphasia -CT had negative for stroke. Shows hematoma right parietal scalp area sutured by ER physician -continue to monitor neuro- status -physical therapy -recommending skilled nursing facility.  Patient and husband are agreeable -ST -is following  2. Hypertension continue home meds -blood pressure has been stable  3. Hypothyroidism  TSH is elevated at 15.776 increasing thyroid dose  4. History of progressive aphasia etiology unknown. Patient follows with [facility]-unclear etiology of the progressive aphasia  5.  History of depression with suicidal ideation Psychiatry consult placed  6.  Abnormal urinalysis with positive leukocyte we will get urine culture and sensitivity    5. DVT  prophylaxis SCD   Disposition skilled nursing facility on Saturday after 3 overnight stay  All the records are reviewed and case discussed with Care Management/Social Workerr. Management plans discussed with the patient, family and they are in agreement.  CODE STATUS: Full code  TOTAL TIME TAKING CARE OF THIS PATIENT: 36 minutes.   POSSIBLE D/C IN 2  DAYS, DEPENDING ON CLINICAL CONDITION.  Note: This dictation was prepared with Dragon dictation along with smaller phrase technology. Any transcriptional errors that result from this process are unintentional.   Ramonita Lab M.D on 04/22/2018 at 3:53 PM  Between 7am to 6pm - Pager - [phone] After 6pm go to [email or web address] - password [email or web address] [facility]  [place] [facility]  Office  [phone]  CC: Primary care physician; [facility]

## 2018-04-22 NOTE — Progress Notes (Addendum)
SLP Cancellation Note  Patient Details Name: Leah Jacobs MRN: [id number] DOB: [birth year]   Cancelled treatment:       Reason Eval/Treat Not Completed: Medical issues which prohibited therapy(chart reviewed; consulted NSG re: pt's status today). Pt is mostly nonverbal but following basic commands; alert. She sat pleasantly in bed while SLP talked w/ Husband; smiled at SLP intermittently. She held the Cup to drink independently during the lunch meal; Husband often fed her though. Husband endorsed pt "may progress into Dementia" per the information he has been given by MDs thus far. She has a baseline dx of Primary Progressive Aphasia(assessed by [facility]) and dizziness(~1-2 years) per chart notes; also noted a recent Behavioral Medicine Assessment during an admission to the ED in Jan. 2020. Noted MRI results 04/22/2018: No acute intracranial abnormality; no infarct.  Pt is currently attending a Speech Group ([facility] in [facility]) for engagement and support. Encouraged pt/Husband to continue post discharge if able as pt seems to enjoy it(per Husband). Recommend f/u w/ the referral to Outpatient Neurology(planned) BEFORE any referral to skilled ST services post discharge. Husband agreed. NSG updated.     Jerilynn Som, MS, CCC-SLP Watson,Katherine 04/22/2018, 2:48 PM

## 2018-04-23 LAB — GLUCOSE, CAPILLARY
GLUCOSE-CAPILLARY: 89 mg/dL (ref 70–99)
Glucose-Capillary: 92 mg/dL (ref 70–99)
Glucose-Capillary: 98 mg/dL (ref 70–99)
Glucose-Capillary: 95 mg/dL (ref 70–99)

## 2018-04-23 NOTE — NC FL2 (Signed)
[place] MEDICAID FL2 LEVEL OF CARE SCREENING TOOL     IDENTIFICATION  Patient Name: Leah Jacobs Birthdate: 1945-09-01 Sex: female Admission Date (Current Location): 04/20/2018  [place] and [place] Number:  Chiropodist and Address:  [facility], [address] [place], [place] [address]      Provider Number: [id number]  Attending Physician Name and Address:  Ramonita Lab, MD  Relative Name and Phone Number:       Current Level of Care: Hospital Recommended Level of Care: Skilled Nursing Facility Prior Approval Number:    Date Approved/Denied:   PASRR Number: [id number]  Discharge Plan: SNF    Current Diagnoses: Patient Active Problem List   Diagnosis Date Noted  . Hypertension 04/21/2018  . Scalp hematoma, initial encounter 04/20/2018  . Dizziness 11/24/2017  . 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 Disequilibrium 09/23/2017  . Sensorineural hearing loss (SNHL) of both ears 09/23/2017  . Bradycardia 04/13/2017  . OSA (obstructive sleep apnea) 03/11/2017  . Mixed hyperlipidemia 05/16/2014  . Acquired hypothyroidism 02/26/2012    Orientation RESPIRATION BLADDER Height & Weight     Self, Place  Normal Incontinent Weight: 150 lb (68 kg) Height:  5\' 4"  (162.6 cm)  BEHAVIORAL SYMPTOMS/MOOD NEUROLOGICAL BOWEL NUTRITION STATUS  (none) (none) Continent Diet(Heart Healthy )  AMBULATORY STATUS COMMUNICATION OF NEEDS Skin   Extensive Assist Verbally Normal                       Personal Care Assistance Level of Assistance  Bathing, Feeding, Dressing Bathing Assistance: Limited assistance Feeding assistance: Independent Dressing Assistance: Limited assistance     Functional Limitations Info  Sight, Hearing, Speech Sight Info: Adequate Hearing Info: Adequate Speech Info: Impaired(Has Aphasia )    SPECIAL CARE FACTORS FREQUENCY  PT (By licensed PT), OT (By licensed OT)     PT Frequency: 5 OT Frequency: 5             Contractures Contractures Info: Not present    Additional Factors Info  Code Status, Allergies Code Status Info: Full Code  Allergies Info: Sulfa Antibiotics, Gadolinium Derivatives, Neosporin            Current Medications (04/23/2018):  This is the current hospital active medication list Current Facility-Administered Medications  Medication Dose Route Frequency Provider Last Rate Last Dose  . acetaminophen (TYLENOL) tablet 650 mg  650 mg Oral Q6H PRN Enedina Finner, MD   650 mg at 04/22/18 2014   Or  . acetaminophen (TYLENOL) suppository 650 mg  650 mg Rectal Q6H PRN Enedina Finner, MD      . amLODipine (NORVASC) tablet 10 mg  10 mg Oral Daily Enedina Finner, MD   10 mg at 04/22/18 1015  . insulin aspart (novoLOG) injection 0-9 Units  0-9 Units Subcutaneous TID WC Enedina Finner, MD   1 Units at 04/22/18 1219  . levothyroxine (SYNTHROID, LEVOTHROID) tablet 100 mcg  100 mcg Oral Q0600 Gouru, Aruna, MD   100 mcg at 04/23/18 0600  . lisinopril (PRINIVIL,ZESTRIL) tablet 20 mg  20 mg Oral Daily Enedina Finner, MD   20 mg at 04/22/18 1016  . ondansetron (ZOFRAN) tablet 4 mg  4 mg Oral Q6H PRN Enedina Finner, MD       Or  . ondansetron [facility]) injection 4 mg  4 mg Intravenous Q6H PRN Enedina Finner, MD      . polyethylene glycol (MIRALAX / GLYCOLAX) packet 17 g  17 g Oral Daily  PRN Enedina Finner, MD      . venlafaxine XR [facility]) 24 hr capsule 150 mg  150 mg Oral Daily Salary, Montell D, MD   150 mg at 04/22/18 1017     Discharge Medications: Please see discharge summary for a list of discharge medications.  Relevant Imaging Results:  Relevant Lab Results:   Additional Information SSN: [id number]  [place], [place]

## 2018-04-23 NOTE — Care Management Important Message (Signed)
Important Message  Patient Details  Name: Leah Jacobs MRN: [id number] Date of Birth: [birth year]   Medicare Important Message Given:  Yes    Olegario Messier A Angelina Venard 04/23/2018, 11:11 AM

## 2018-04-23 NOTE — Care Management (Signed)
Spoke with husband of Leah Jacobs per telephone to discuss Home Health agencies. States he is unsure of which agency he would like in the home. Query of Medicare Home Health government agencies completed. One placed on chart one given to [facility]  (caregiver). Mr Sides said he would look at this list and let us know which agency he would like. Gwenette Greet RN MSN CCM Care Management [phone]

## 2018-04-23 NOTE — Progress Notes (Signed)
   04/23/18 1300  Clinical Encounter Type  Visited With Patient and family together  Visit Type Initial  Ch was rounding. Pt was a little slow to response but was in good mood.

## 2018-04-23 NOTE — Progress Notes (Signed)
[facility] [place] at [facility]   PATIENT NAME: Leah Jacobs    MR#:  [id number]  DATE OF BIRTH:  11-13-1945  SUBJECTIVE:  CHIEF COMPLAINT: Patient is more alert today and resting comfortably  Caregiver at bedside  REVIEW OF SYSTEMS:  Review of system unobtainable  DRUG ALLERGIES:   Allergies  Allergen Reactions  . Sulfa Antibiotics Hives  . Gadolinium Derivatives     Unknown; pt does not remember reaction  . Neosporin [Neomycin-Bacitracin Zn-Polymyx] Rash    VITALS:  Blood pressure (!) 112/54, pulse (!) 50, temperature 97.6 F (36.4 C), temperature source Oral, resp. rate 16, height 5\' 4"  (1.626 m), weight 68 kg, SpO2 92 %.  PHYSICAL EXAMINATION:  GENERAL:  73 y.o.-year-old patient lying in the bed with no acute distress.  EYES: Pupils equal, round, reactive to light and accommodation. No scleral icterus. Extraocular muscles intact.  HEENT: Head atraumatic, normocephalic. Oropharynx and nasopharynx clear.  NECK:  Supple, no jugular venous distention. No thyroid enlargement, no tenderness.  LUNGS: Normal breath sounds bilaterally, no wheezing, rales,rhonchi or crepitation. No use of accessory muscles of respiration.  CARDIOVASCULAR: S1, S2 normal. No murmurs, rubs, or gallops.  ABDOMEN: Soft, nontender, nondistended. Bowel sounds present. No organomegaly or mass.  EXTREMITIES: No pedal edema, cyanosis, or clubbing.  NEUROLOGIC: Awake and alert, follows verbal command sensation intact. Gait not checked.  PSYCHIATRIC: The patient is alert and oriented x 2-3.  SKIN: No obvious rash, lesion, or ulcer.    LABORATORY PANEL:   CBC Recent Labs  Lab 04/20/18 0915  WBC 8.2  HGB 12.5  HCT 37.5  PLT 257   ------------------------------------------------------------------------------------------------------------------  Chemistries  Recent Labs  Lab 04/20/18 0915  NA 135  K 3.7  CL 104  CO2 23  GLUCOSE 188*  BUN 20  CREATININE 0.68   CALCIUM 8.5*  AST 34  ALT 30  ALKPHOS 50  BILITOT 0.8   ------------------------------------------------------------------------------------------------------------------  Cardiac Enzymes Recent Labs  Lab 04/20/18 0915  TROPONINI <0.03   ------------------------------------------------------------------------------------------------------------------  RADIOLOGY:  Mr Brain Wo Contrast  Result Date: 04/21/2018 CLINICAL DATA:  73 year old female found at the bottom of the flight of stairs. Scalp hematoma. History of progressive aphasia, ataxia. EXAM: MRI HEAD WITHOUT CONTRAST TECHNIQUE: Multiplanar, multiecho pulse sequences of the brain and surrounding structures were obtained without intravenous contrast. COMPARISON:  Head and cervical spine CT earlier today. Brain MRI 11/27/2016. FINDINGS: Brain: No restricted diffusion to suggest acute infarction. No midline shift, mass effect, evidence of mass lesion, ventriculomegaly, extra-axial collection or acute intracranial hemorrhage. Cervicomedullary junction and pituitary are within normal limits. Scattered small cerebral white matter T2 and FLAIR hyperintense foci are stable since 2018, and mild for age. No cortical encephalomalacia. There are 2 small sub appended mole foci of chronic hemorrhage associated with the right lateral ventricle. No other chronic cerebral blood products. Deep gray matter nuclei, brainstem, and cerebellum are normal for age. Cerebral volume is stable since 2018. Vascular: Major intracranial vascular flow voids are stable since 2018. Skull and upper cervical spine: Negative visible cervical spine. Visualized bone marrow signal is within normal limits. Sinuses/Orbits: Stable and negative. Other: Mastoids are clear. Visible internal auditory structures appear normal. Right parietal convexity scalp hematoma with evidence of skin staples. Face soft tissues appear negative. IMPRESSION: 1.  No acute intracranial abnormality. Stable  since 2018 noncontrast MRI appearance of the brain with mild for age signal changes most commonly due to chronic small vessel disease. 2. Right scalp hematoma. Electronically Signed  By: Odessa Fleming M.D.   On: 04/21/2018 14:19    EKG:   Orders placed or performed during the hospital encounter of 04/20/18  . ED EKG  . ED EKG  . EKG 12-Lead  . EKG 12-Lead  . EKG 12-Lead  . EKG 12-Lead    ASSESSMENT AND PLAN:   Leah Jacobs  is a 73 y.o. female with a known history of hypertension, hypothyroidism, progressive aphasia followed by [facility] neurology with etiology unclear comes to the emergency room after her husband heard a loud thump this morning and found patient at the bottom of a flight of stairs. Sure how and at what steps patient slipped and fell.  1.   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 -MRI of the brain -no acute intracranial abnormality -Neurology consult placed discussed with PA Ouma recommending to continue outpatient follow-up with [facility] neurology -Patient unable to give history given progressive aphasia -CT had negative for stroke. Shows hematoma right parietal scalp area sutured by ER physician -continue to monitor neuro- status -physical therapy -recommending skilled nursing facility.  Patient and husband are agreeable -ST -is following  2. Hypertension continue home meds -blood pressure has been stable  3. Hypothyroidism  TSH is elevated at 15.776 increased synthyroid dose  4. History of progressive aphasia etiology unknown. Patient follows with [facility] neurology-unclear etiology of the progressive aphasia  5.  History of depression with suicidal ideation Seen by psychiatry.  Does not meet inpatient psych criteria  6.  Abnormal urinalysis with positive leukocyte we will get urine culture and sensitivity pending not  started on antibiotics at this point    5. DVT prophylaxis SCD   Disposition skilled nursing facility on Saturday after 3 overnight stay  All the records are reviewed and case discussed with Care Management/Social Workerr. Management plans discussed with the patient's caregiver at bedside and call placed to patient's husband to give an update could not reach the husband   CODE STATUS: Full code  TOTAL TIME TAKING CARE OF THIS PATIENT: 36 minutes.   POSSIBLE D/C IN 1  DAYS, DEPENDING ON CLINICAL CONDITION.  Note: This dictation was prepared with Dragon dictation along with smaller phrase technology. Any transcriptional errors that result from this process are unintentional.   Ramonita Lab M.D on 04/23/2018 at 1:04 PM  Between 7am to 6pm - Pager - [phone] After 6pm go to [email or web address] - password [email or web address] [facility]  [place] [facility]  Office  [phone]  CC: Primary care physician; [facility]

## 2018-04-23 NOTE — Clinical Social Work Note (Signed)
Clinical Social Work Assessment  Patient Details  Name: Leah Jacobs MRN: [id number] Date of Birth: 12-27-1945  Date of referral:  04/23/18               Reason for consult:  Facility Placement                Permission sought to share information with:  Case Manager, Customer service manager, Family Supports Permission granted to share information::  Yes, Verbal Permission Granted  Name::      SNF  Agency::   [facility]   Relationship::     Contact Information:     Housing/Transportation Living arrangements for the past 2 months:  Single Family Home Source of Information:  Spouse Patient Interpreter Needed:  None Criminal Activity/Legal Involvement Pertinent to Current Situation/Hospitalization:  No - Comment as needed Significant Relationships:  Spouse, Adult Children Lives with:  Spouse Do you feel safe going back to the place where you live?  Yes Need for family participation in patient care:  Yes (Comment)  Care giving concerns:  Patient lives with husband in [place]    Social Worker assessment / plan:  CSW consulted for facility placement. CSW met with patient and caregiver at bedside. Patient has aphasia and unable to communicate. CSW spoke with patient's husband Leah Jacobs on caregiver's phone. CSW explained PT recommendation of SNF. Husband states that patient has been in a facility in the past and it was not beneficial for patient. Husband states that he would prefer to take patient home with caregiver and home health. CSW notified RNCM of above. CSW signing off. Please re consult if further needs arise.   Employment status:  Retired Forensic scientist:  [employer] PT Recommendations:  [place] / Referral to community resources:  [place]  Patient/Family's Response to care:  Husband thanked CSW for assistance   Patient/Family's Understanding of and Emotional Response to Diagnosis, Current Treatment, and  Prognosis:  Husband states understanding of current treatment and diagnoses.   Emotional Assessment Appearance:  Appears stated age Attitude/Demeanor/Rapport:    Affect (typically observed):    Orientation:  Oriented to Self Alcohol / Substance use:  Not Applicable Psych involvement (Current and /or in the community):  No (Comment)  Discharge Needs  Concerns to be addressed:  Discharge Planning Concerns Readmission within the last 30 days:  No Current discharge risk:  None Barriers to Discharge:  Continued Medical Work up   [employer], [place] 04/23/2018, 11:19 AM

## 2018-04-24 LAB — GLUCOSE, CAPILLARY: Glucose-Capillary: 115 mg/dL — ABNORMAL HIGH (ref 70–99)

## 2018-04-24 LAB — URINE CULTURE: Special Requests: NORMAL

## 2018-04-24 MED ORDER — VENLAFAXINE HCL ER 150 MG PO CP24: 150.0000 mg | ORAL_CAPSULE | Freq: Every day | ORAL | 0 refills | Status: DC

## 2018-04-24 NOTE — Plan of Care (Signed)
  Problem: Safety: Goal: Ability to remain free from injury will improve Outcome: Progressing   Problem: Skin Integrity: Goal: Risk for impaired skin integrity will decrease Outcome: Progressing   

## 2018-04-24 NOTE — Discharge Summary (Signed)
[facility] [place] at [facility]   PATIENT NAME: Leah Jacobs    MR#:  [id number]  DATE OF BIRTH:  1945-11-08  DATE OF ADMISSION:  04/20/2018 ADMITTING PHYSICIAN: Enedina Finner, MD  DATE OF DISCHARGE: 04/24/2018  PRIMARY CARE PHYSICIAN: [facility]    ADMISSION DIAGNOSIS:  Fall, initial encounter [W19.XXXA]  DISCHARGE DIAGNOSIS:  *right scalp hematoma status post mechanical fall status post suturing with staples in the ER  SECONDARY DIAGNOSIS:   Past Medical History:  Diagnosis Date  . Hypertension   . Stroke (HCC)   . Thyroid disease     HOSPITAL COURSE:   JudyCaminitiis a72 y.o.femalewith a known history of hypertension, hypothyroidism, progressive aphasia followed by [facility] neurology with etiology unclear comes to the emergency room after her husband heard a loud thump this morning and found patient at the bottom of a flight of stairs.Sure how and at what steps patient slipped and fell.  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 -MRI of the brain -no acute intracranial abnormality -Neurology consult placed discussed with PA Ouma recommending to continue outpatient follow-up with [facility] neurology -Patient unable to give history given progressive aphasia -CT had negative for stroke. Shows hematoma right parietal scalp area sutured by ER physician on 04/20/2018 with 15 staples. Pt will f/u PCP In a week to get staples removed -physical therapy -recommending skilled nursing facility.   -Husband wants to take pt home -ST is following- cont outpt ST  2.Hypertension continue home meds -blood pressure has been stable -amlodipine and lisinopril  3.Hypothyroidism  TSH is elevated at 15.776 increased synthyroid dose  4.History of progressive aphasia etiology unknown. Patient  follows with [facility] neurology-unclear etiology of the progressive aphasia  5.  History of depression with suicidal ideation Seen by psychiatry.  Does not meet inpatient psych criteria -Effexor XR 150 mg qd (recently increased dose when pt was at [facility])  Overall appears to be at baseline. D/c today   CONSULTS OBTAINED:  Treatment Team:  Pauletta Browns, MD Mariel Craft, MD  DRUG ALLERGIES:   Allergies  Allergen Reactions  . Sulfa Antibiotics Hives  . Gadolinium Derivatives     Unknown; pt does not remember reaction  . Neosporin [Neomycin-Bacitracin Zn-Polymyx] Rash    DISCHARGE MEDICATIONS:   Allergies as of 04/24/2018      Reactions   Sulfa Antibiotics Hives   Gadolinium Derivatives    Unknown; pt does not remember reaction   Neosporin [neomycin-bacitracin Zn-polymyx] Rash      Medication List    STOP taking these medications   valsartan 40 MG tablet Commonly known as:  DIOVAN     TAKE these medications   amLODipine 10 MG tablet Commonly known as:  NORVASC Take 10 mg by mouth daily.   levothyroxine 75 MCG tablet Commonly known as:  SYNTHROID, LEVOTHROID Take 75 mcg by mouth daily before breakfast.   lisinopril 20 MG tablet Commonly known as:  PRINIVIL,ZESTRIL Take 20 mg by mouth daily.   meclizine 12.5 MG tablet Commonly known as:  ANTIVERT Take 12.5 mg by mouth 3 (three) times daily as needed for dizziness.   venlafaxine XR 150 MG 24 hr capsule Commonly known as:  EFFEXOR-XR Take 1 capsule (150 mg total) by mouth daily. What changed:    medication strength  how much to take   Vitamin D (Ergocalciferol) 1.25  MG (50000 UT) Caps capsule Commonly known as:  DRISDOL Take 50,000 Units by mouth once a week.       If you experience worsening of your admission symptoms, develop shortness of breath, life threatening emergency, suicidal or homicidal thoughts you must seek medical attention immediately by calling 911 or calling your MD  immediately  if symptoms less severe.  You Must read complete instructions/literature along with all the possible adverse reactions/side effects for all the Medicines you take and that have been prescribed to you. Take any new Medicines after you have completely understood and accept all the possible adverse reactions/side effects.   Please note  You were cared for by a hospitalist during your hospital stay. If you have any questions about your discharge medications or the care you received while you were in the hospital after you are discharged, you can call the unit and asked to speak with the hospitalist on call if the hospitalist that took care of you is not available. Once you are discharged, your primary care physician will handle any further medical issues. Please note that NO REFILLS for any discharge medications will be authorized once you are discharged, as it is imperative that you return to your primary care physician (or establish a relationship with a primary care physician if you do not have one) for your aftercare needs so that they can reassess your need for medications and monitor your lab values. Today   SUBJECTIVE   More awake and alert. No issues per RN  VITAL SIGNS:  Blood pressure (!) 118/58, pulse (!) 52, temperature (!) 97.5 F (36.4 C), temperature source Oral, resp. rate 17, height 5\' 4"  (1.626 m), weight 68 kg, SpO2 91 %.  I/O:    Intake/Output Summary (Last 24 hours) at 04/24/2018 0718 Last data filed at 04/23/2018 2215 Gross per 24 hour  Intake 60 ml  Output -  Net 60 ml    PHYSICAL EXAMINATION:  GENERAL:  73 y.o.-year-old patient lying in the bed with no acute distress.  EYES: Pupils equal, round, reactive to light and accommodation. No scleral icterus. Extraocular muscles intact.  HEENT: Head sutures 15 staples+ right scalp (posteriorly), normocephalic. Oropharynx and nasopharynx clear.  NECK:  Supple, no jugular venous distention. No thyroid enlargement,  no tenderness.  LUNGS: Normal breath sounds bilaterally, no wheezing, rales,rhonchi or crepitation. No use of accessory muscles of respiration.  CARDIOVASCULAR: S1, S2 normal. No murmurs, rubs, or gallops.  ABDOMEN: Soft, non-tender, non-distended. Bowel sounds present. No organomegaly or mass.  EXTREMITIES: No pedal edema, cyanosis, or clubbing.  NEUROLOGIC: Cranial nerves II through XII are intact. Muscle strength 5/5 in all extremities. Sensation intact. Gait not checked.  PSYCHIATRIC: The patient is alert and  aphasic SKIN: No obvious rash, lesion, or ulcer.   DATA REVIEW:   CBC  Recent Labs  Lab 04/20/18 0915  WBC 8.2  HGB 12.5  HCT 37.5  PLT 257    Chemistries  Recent Labs  Lab 04/20/18 0915  NA 135  K 3.7  CL 104  CO2 23  GLUCOSE 188*  BUN 20  CREATININE 0.68  CALCIUM 8.5*  AST 34  ALT 30  ALKPHOS 50  BILITOT 0.8    Microbiology Results   No results found for this or any previous visit (from the past 240 hour(s)).  RADIOLOGY:  No results found.   CODE STATUS:     Code Status Orders  (From admission, onward)         Start  Ordered   04/20/18 1824  Full code  Continuous     04/20/18 1823        Code Status History    This patient has a current code status but no historical code status.    Advance Directive Documentation     Most Recent Value  Type of Advance Directive  Healthcare Power of Attorney, Living will  Pre-existing out of facility DNR order (yellow form or pink MOST form)  -  "MOST" Form in Place?  -      TOTAL TIME TAKING CARE OF THIS PATIENT: *40* minutes.    Enedina Finner M.D on 04/24/2018 at 7:18 AM  Between 7am to 6pm - Pager - [phone] After 6pm go to [email or web address] - Social research officer, government  [facility] [place] [facility]  Office  [phone]  CC: Primary care physician; [facility]

## 2018-04-24 NOTE — Care Management Note (Signed)
Case Management Note  Patient Details  Name: Leah Jacobs MRN: [id number] Date of Birth: Jun 01, 1945  Subjective/Objective:   Patient to be discharged per MD order. Orders in place for home health services. Spouse has spoke several times with Case management team regarding disposition. Spouse agreeable to home health and prefers [facility]. Referral placed with [facility]. No DME needs. Spouse to transport.                  Action/Plan:   Expected Discharge Date:  04/24/18               Expected Discharge Plan:  Home w Home Health Services  In-House Referral:     Discharge planning Services  CM Consult  Post Acute Care Choice:  Home Health Choice offered to:  Spouse  DME Arranged:    DME Agency:     HH Arranged:  RN, PT, Nurse's Aide, Speech Therapy, Social Work [employer] Agency:  [facility]  Status of Service:  Completed, signed off  If discussed at [employer], dates discussed:    Additional Comments:  Virgel Manifold, RN 04/24/2018, 11:39 AM

## 2018-04-24 NOTE — Progress Notes (Signed)
Husband aware of discharge home, agrees, states that he will be here soon for transport

## 2018-04-24 NOTE — Progress Notes (Signed)
Oral and written AVS instructions/education completed with husband.  Stood at bedside with 1+ and took few steps. Reinforced need for appt for staple removal next week from scalp laceration. Discussed desired referral to neurologist that works with expressive aphasia- advised of resources and access. Discharge home with family with [facility] services.

## 2018-04-24 NOTE — Discharge Instructions (Signed)

## 2018-04-28 ENCOUNTER — Telehealth: Payer: Self-pay

## 2018-04-28 NOTE — Telephone Encounter (Signed)
EMMI Follow-up: Noted on the report that the patient had some questions about her discharge papers, wasn't sure who to call if there was a change in her condition, no follow-up appointment scheduled and some other questions.  I called to talk with Leah Jacobs and her husband, Zoe Lan said she was sleeping as she had been to see MD in [place] today and was waiting to hear back if they could find a rehab. bed for her.  He said her condition worsened since discharge and she can barely speak two words right now. He said, he didn't realize how weak she was and has fell twice since returning home. I sympathized with spouse that her condition had declined and hoped they could locate a rehab. bed soon.

## 2018-05-10 ENCOUNTER — Telehealth: Payer: Self-pay

## 2018-05-10 NOTE — Telephone Encounter (Signed)
Post discharge note entry 05/10/18:  This RNCM received notification from Patient Relations that husband Nicole Kindred [phone] "is desperate for help and unable to reach anyone to get his wife to a rehab facility".  RNCM reached out to [place] to confirm need. He states that "Josh the care Freight forwarder met with me several times prior to discharge asking me to consider rehab at a facility however I thought she/patient would be fine at home".  He states "when I got her home she couldn't even get up the stairs to get into the home".  He states [facility] "[facility] and he told them he didn't want home health- he wants her to go to rehab at either [facility] or [facility]".  He didn't understand that declining home health services would stop rehab in the home.  Patient's PCP has completed FL2.  RNCM has reached out to hospital CSW lead and to representative [place] with [facility] to follow up- and to add back home PT.

## 2018-05-10 NOTE — Care Management (Deleted)
Post discharge note entry 05/10/18:  This RNCM received notification from Patient Relations that husband Nicole Kindred [phone] "is desperate for help and unable to reach anyone to get his wife to a rehab facility".  RNCM reached out to [place] to confirm need. He states that "Josh the care Freight forwarder met with me several times prior to discharge asking me to consider rehab at a facility however I thought she/patient would be fine at home".  He states "when I got her home she couldn't even get up the stairs to get into the home".  He states that "[facility] and he told them he didn't want home health- he wants her to go to rehab at either [facility] or [facility]".  He didn't understand that declining home health services would stop rehab in the home.  Patient's PCP has completed FL2.  RNCM has reached out to hospital CSW lead and to representative [place] with [facility] to follow up- and to add back home PT.

## 2018-05-10 NOTE — Telephone Encounter (Signed)
RNCM attempted to reach back to patient/husband to update that [facility] will be in touch to start care.  Patient will need home health PT until she either improves with rehab or needs to go to SNF for rehab.  There was no answer and I was not able to leave a message.

## 2018-05-11 NOTE — Telephone Encounter (Signed)
RNCM received notification that patient had reached out again to Patient Experience wanting to talk to this RNCM.  I contacted patient's husband Leah Jacobs by phone and was able to reach him.  He is going to call [facility] SNF to see if they have any beds.  He understood from [facility] and [facility] that his wife is on a waiting list and they do not currently have any beds available.  He understands the importance of physical therapy in the home while waiting on a rehab bed. He states that he will call [facility] to arrange a date/time to start services.  RNCM gave Leah Jacobs [phone] for [facility] to assist with any questions. He had no other questions at this time.

## 2018-05-12 ENCOUNTER — Telehealth: Payer: Self-pay

## 2018-05-12 NOTE — Telephone Encounter (Signed)
05/12/2018 at 1527- RNCM received callback from husband Alinda Money stating that [facility] will take patient tomorrow for short term rehab.  RNCM notified Barbara Cower with [facility] that home health services will no longer be required at this time.

## 2018-07-04 ENCOUNTER — Encounter: Payer: Self-pay | Admitting: Emergency Medicine

## 2018-07-04 ENCOUNTER — Emergency Department
Admission: EM | Admit: 2018-07-04 | Discharge: 2018-07-04 | Disposition: A | Discharge status: Skilled Nursing Facility | Payer: Medicare Other | Attending: Emergency Medicine | Admitting: Emergency Medicine

## 2018-07-04 ENCOUNTER — Emergency Department: Payer: Medicare Other

## 2018-07-04 DIAGNOSIS — Y998 Other external cause status: Secondary | ICD-10-CM | POA: Insufficient documentation

## 2018-07-04 DIAGNOSIS — Y9301 Activity, walking, marching and hiking: Secondary | ICD-10-CM | POA: Diagnosis not present

## 2018-07-04 DIAGNOSIS — S0101XA Laceration without foreign body of scalp, initial encounter: Secondary | ICD-10-CM | POA: Insufficient documentation

## 2018-07-04 DIAGNOSIS — Y92129 Unspecified place in nursing home as the place of occurrence of the external cause: Secondary | ICD-10-CM | POA: Insufficient documentation

## 2018-07-04 DIAGNOSIS — Z79899 Other long term (current) drug therapy: Secondary | ICD-10-CM | POA: Insufficient documentation

## 2018-07-04 DIAGNOSIS — S0181XA Laceration without foreign body of other part of head, initial encounter: Secondary | ICD-10-CM

## 2018-07-04 DIAGNOSIS — W01198A Fall on same level from slipping, tripping and stumbling with subsequent striking against other object, initial encounter: Secondary | ICD-10-CM | POA: Diagnosis not present

## 2018-07-04 DIAGNOSIS — S0083XA Contusion of other part of head, initial encounter: Secondary | ICD-10-CM | POA: Insufficient documentation

## 2018-07-04 DIAGNOSIS — I10 Essential (primary) hypertension: Secondary | ICD-10-CM | POA: Insufficient documentation

## 2018-07-04 DIAGNOSIS — W19XXXA Unspecified fall, initial encounter: Secondary | ICD-10-CM

## 2018-07-04 DIAGNOSIS — S0990XA Unspecified injury of head, initial encounter: Secondary | ICD-10-CM | POA: Diagnosis present

## 2018-07-04 LAB — URINALYSIS, COMPLETE (UACMP) WITH MICROSCOPIC
Bacteria, UA: NONE SEEN
Bilirubin Urine: NEGATIVE
Glucose, UA: NEGATIVE mg/dL
Hgb urine dipstick: NEGATIVE
Ketones, ur: NEGATIVE mg/dL
Nitrite: NEGATIVE
Protein, ur: NEGATIVE mg/dL
Specific Gravity, Urine: 1.024 (ref 1.005–1.030)
pH: 5 (ref 5.0–8.0)

## 2018-07-04 LAB — GLUCOSE, CAPILLARY: Glucose-Capillary: 108 mg/dL — ABNORMAL HIGH (ref 70–99)

## 2018-07-04 NOTE — ED Triage Notes (Signed)
Per CF. Groce, patient had a witnessed fall at [facility]. Patient has not had any change in mental status and has not lost consciousness. Patient bumped her head during fall and has a laceration on her forehead.

## 2018-07-04 NOTE — ED Notes (Signed)
Report given to [facility]. 

## 2018-07-04 NOTE — ED Notes (Signed)
EMS provided vitals BP: 142/74 RR: 22 SPo2: 98% HR: 72 Temp: 99.1 F CBG: 144

## 2018-07-04 NOTE — ED Notes (Signed)
Pt unable to sign discharge due to mental status.  

## 2018-07-04 NOTE — ED Notes (Signed)
Asked Diplomatic Services operational officer to call [facility]

## 2018-07-04 NOTE — ED Notes (Signed)
Attempted to call report- was told that they were trying to get in touch with their supervisor and would call back

## 2018-07-04 NOTE — ED Notes (Addendum)
Per Dr Fanny Bien- both lacs on forehead cleaned with soap and water

## 2018-07-04 NOTE — ED Notes (Signed)
Pt has been to CT and just return

## 2018-07-04 NOTE — ED Provider Notes (Signed)
[facility] Emergency Department Provider Note ____________________________________________   First MD Initiated Contact with Patient 07/04/18 [phone]     (approximate)  I have reviewed the triage vital signs and the nursing notes.  HISTORY  Chief Complaint Fall  EM caveat: The patient a phasic and has notable communication barriers due to progressive aphasia  HPI Leah Jacobs is a 73 y.o. 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  She has had many falls while there.  Today while being assisted he was told she fell forward and caught her head crowned prompting her to come the ER  The patient cannot provide history but does give thumbs up thumbs down, she denies being in pain.  Denies chest pain or trouble breathing.  Communication is a bit of a barrier though however, not felt super reliable.  However husband reports she is able to communicate this way.  She does give a thumbs up to if she is feeling all right.  Husband reports she has multiple falls, she is evaluated regularly by Dr. and rounding medical team at her facility  Past Medical History:  Diagnosis Date  . Hypertension   . Stroke (HCC)   . Thyroid disease     Patient Active Problem List   Diagnosis Date Noted  . Hypertension 04/21/2018  . Scalp hematoma, initial encounter 04/20/2018  . Dizziness 11/24/2017  . 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 Disequilibrium 09/23/2017  . Sensorineural hearing loss (SNHL) of both ears 09/23/2017  . Bradycardia 04/13/2017  . OSA (obstructive sleep apnea) 03/11/2017  . Mixed hyperlipidemia 05/16/2014  . Acquired hypothyroidism 02/26/2012    Past Surgical History:  Procedure Laterality Date  . TONSILLECTOMY      Prior to Admission medications   Medication Sig Start Date End Date Taking? Authorizing Provider  amLODipine  (NORVASC) 10 MG tablet Take 10 mg by mouth daily. 03/17/18   [provider]  levothyroxine (SYNTHROID, LEVOTHROID) 75 MCG tablet Take 75 mcg by mouth daily before breakfast. 01/28/18   [provider]  lisinopril (PRINIVIL,ZESTRIL) 20 MG tablet Take 20 mg by mouth daily. 03/14/18   [provider]  meclizine (ANTIVERT) 12.5 MG tablet Take 12.5 mg by mouth 3 (three) times daily as needed for dizziness. 08/25/17   [provider]  venlafaxine XR (EFFEXOR-XR) 150 MG 24 hr capsule Take 1 capsule (150 mg total) by mouth daily. 04/24/18   Enedina Finner, MD  Vitamin D, Ergocalciferol, (DRISDOL) 1.25 MG (50000 UT) CAPS capsule Take 50,000 Units by mouth once a week. 04/19/18   [provider]    Allergies Sulfa antibiotics; Gadolinium derivatives; and Neosporin [neomycin-bacitracin zn-polymyx]  History reviewed. No pertinent family history.  Social History Social History   Tobacco Use  . Smoking status: Never Smoker  . Smokeless tobacco: Never Used  Substance Use Topics  . Alcohol use: Yes  . Drug use: No    Review of Systems husband reports that she has not had any recent acute illness such as fever cough or other concerns reported at rehab ____________________________________________   PHYSICAL EXAM:  VITAL SIGNS: ED Triage Vitals  Enc Vitals Group     BP 07/04/18 0622 132/62     Pulse Rate 07/04/18 0622 60     Resp 07/04/18 0622 18     Temp 07/04/18 0622 97.8 F (36.6 C)     Temp  Source 07/04/18 0622 Oral     SpO2 07/04/18 0622 98 %     Weight --      Height 07/04/18 0628  (1.626 m)     Head Circumference --      Peak Flow --      Pain Score --      Pain Loc --      Pain Edu? --      Excl. in GC? --     Constitutional: Alert and awake and communicates with some thumbs-up type movements, and also does follow visual commands and answers simple questions with movements. Eyes: Conjunctivae are normal. Head: Atraumatic except for 2  lacerations that are superficial over the right frontal forehead, also associated small hematoma. Nose: No congestion/rhinnorhea. Mouth/Throat: Mucous membranes are moist. Neck: No stridor.  Does not appear to have tenderness to palpation along the length of the spinal column there are no step-offs or deformities. Cardiovascular: Normal rate, regular rhythm. Grossly normal heart sounds.  Good peripheral circulation. Respiratory: Normal respiratory effort.  No retractions. Lungs CTAB. Gastrointestinal: Soft and nontender. No distention. Musculoskeletal: No lower extremity tenderness nor edema.  She does move all extremities to command with about 4 out of 5 strength, but generally fatigued. Neurologic: She does not speak.  She communicates by movements, does move all extremities.  There is no facial droop.  She is fully alert Skin:  Skin is warm, dry and intact. No rash noted. Psychiatric: Mood and affect are calm in no distress  ____________________________________________   LABS (all labs ordered are listed, but only abnormal results are displayed)  Labs Reviewed  URINALYSIS, COMPLETE (UACMP) WITH MICROSCOPIC - Abnormal; Notable for the following components:      Result Value   Color, Urine YELLOW (*)    APPearance CLOUDY (*)    Leukocytes,Ua TRACE (*)    All other components within normal limits  GLUCOSE, CAPILLARY - Abnormal; Notable for the following components:   Glucose-Capillary 108 (*)    All other components within normal limits  URINE CULTURE  CBG MONITORING, ED   ____________________________________________  EKG  ED ECG REPORT I, Sharyn Creamer, the attending physician, personally viewed and interpreted this ECG.  Date: 07/04/2018 EKG Time: 830 Rate: 65 Rhythm: normal sinus rhythm QRS Axis: normal Intervals: normal ST/T Wave abnormalities: normal Narrative Interpretation: no evidence of acute ischemia  ____________________________________________  RADIOLOGY  Ct  Head Wo Contrast  Result Date: 07/04/2018 CLINICAL DATA:  Fall EXAM: CT HEAD WITHOUT CONTRAST CT CERVICAL SPINE WITHOUT CONTRAST TECHNIQUE: Multidetector CT imaging of the head and cervical spine was performed following the standard protocol without intravenous contrast. Multiplanar CT image reconstructions of the cervical spine were also generated. COMPARISON:  CT head and cervical spine 04/20/2018 FINDINGS: CT HEAD FINDINGS Brain: There is no mass, hemorrhage or extra-axial collection. The size and configuration of the ventricles and extra-axial CSF spaces are normal. The brain parenchyma is normal, without evidence of acute or chronic infarction. Vascular: No abnormal hyperdensity of the major intracranial arteries or dural venous sinuses. No intracranial atherosclerosis. Skull: Right frontal scalp hematoma/laceration.  No skull fracture. Sinuses/Orbits: No fluid levels or advanced mucosal thickening of the visualized paranasal sinuses. No mastoid or middle ear effusion. The orbits are normal. CT CERVICAL SPINE FINDINGS Alignment: No static subluxation. Facets are aligned. Occipital condyles are normally positioned. Skull base and vertebrae: No acute fracture. Soft tissues and spinal canal: No prevertebral fluid or swelling. No visible canal hematoma. Disc levels: Multilevel facet hypertrophy  with foraminal stenosis that is greatest at left C5-6. Upper chest: No pneumothorax, pulmonary nodule or pleural effusion. Other: Normal visualized paraspinal cervical soft tissues. IMPRESSION: 1. No acute intracranial abnormality. 2. Right frontal scalp hematoma without skull fracture. 3. No acute fracture or static subluxation of the cervical spine. Electronically Signed   By: Deatra Robinson M.D.   On: 07/04/2018 06:54   Ct Cervical Spine Wo Contrast  Result Date: 07/04/2018 CLINICAL DATA:  Fall EXAM: CT HEAD WITHOUT CONTRAST CT CERVICAL SPINE WITHOUT CONTRAST TECHNIQUE: Multidetector CT imaging of the head and  cervical spine was performed following the standard protocol without intravenous contrast. Multiplanar CT image reconstructions of the cervical spine were also generated. COMPARISON:  CT head and cervical spine 04/20/2018 FINDINGS: CT HEAD FINDINGS Brain: There is no mass, hemorrhage or extra-axial collection. The size and configuration of the ventricles and extra-axial CSF spaces are normal. The brain parenchyma is normal, without evidence of acute or chronic infarction. Vascular: No abnormal hyperdensity of the major intracranial arteries or dural venous sinuses. No intracranial atherosclerosis. Skull: Right frontal scalp hematoma/laceration.  No skull fracture. Sinuses/Orbits: No fluid levels or advanced mucosal thickening of the visualized paranasal sinuses. No mastoid or middle ear effusion. The orbits are normal. CT CERVICAL SPINE FINDINGS Alignment: No static subluxation. Facets are aligned. Occipital condyles are normally positioned. Skull base and vertebrae: No acute fracture. Soft tissues and spinal canal: No prevertebral fluid or swelling. No visible canal hematoma. Disc levels: Multilevel facet hypertrophy with foraminal stenosis that is greatest at left C5-6. Upper chest: No pneumothorax, pulmonary nodule or pleural effusion. Other: Normal visualized paraspinal cervical soft tissues. IMPRESSION: 1. No acute intracranial abnormality. 2. Right frontal scalp hematoma without skull fracture. 3. No acute fracture or static subluxation of the cervical spine. Electronically Signed   By: Deatra Robinson M.D.   On: 07/04/2018 06:54     CT scans reviewed negative for acute cranial injury, no cervical fracture. ____________________________________________   PROCEDURES  Procedure(s) performed: Lacerations, see procedure note(s).  Marland Kitchen.Laceration Repair Date/Time: 07/04/2018 8:17 AM Performed by: Sharyn Creamer, MD Authorized by: Sharyn Creamer, MD   Consent:    Consent obtained:  Verbal   Consent given by:   Spouse Anesthesia (see MAR for exact dosages):    Anesthesia method:  None Laceration details:    Location:  Scalp   Scalp location:  Frontal   Length (cm):  4   Depth (mm):  2 Repair type:    Repair type:  Simple Exploration:    Contaminated: no   Treatment:    Area cleansed with:  Soap and water   Amount of cleaning:  Standard   Visualized foreign bodies/material removed: no   Skin repair:    Repair method:  Tissue adhesive Approximation:    Approximation:  Close Post-procedure details:    Dressing:  Open (no dressing)   Patient tolerance of procedure:  Tolerated well, no immediate complications .Marland KitchenLaceration Repair Date/Time: 07/04/2018 8:18 AM Performed by: Sharyn Creamer, MD Authorized by: Sharyn Creamer, MD   Consent:    Consent obtained:  Verbal   Consent given by:  Spouse Anesthesia (see MAR for exact dosages):    Anesthesia method:  None Laceration details:    Location:  Scalp   Length (cm):  3   Depth (mm):  2 Repair type:    Repair type:  Simple Exploration:    Contaminated: no   Treatment:    Area cleansed with:  Soap and water   Amount  of cleaning:  Standard   Visualized foreign bodies/material removed: no   Skin repair:    Repair method:  Tissue adhesive Approximation:    Approximation:  Close Post-procedure details:    Dressing:  Open (no dressing)   Patient tolerance of procedure:  Tolerated well, no immediate complications    Critical Care performed: No  ____________________________________________   INITIAL IMPRESSION / ASSESSMENT AND PLAN / ED COURSE  Pertinent labs & imaging results that were available during my care of the patient were reviewed by me and considered in my medical decision making (see chart for details).   Fall.  Witnessed by staff were assisting her.  She has difficulty walking.  Does not appear to have acute neurologic deficit but has baseline progressive aphasia and communicating difficulties and weakness per the husband.   I discussed with the husband her goals of care as he is her decision-maker, we discussed obtaining lab work and evaluating for other causes of weakness but he advises he just would like for her to have a urine sample done to make sure there is no urinary infection, otherwise he does not wish for her to have any blood work as she seen and evaluated [facility] medical [facility] and she has not had any sudden symptoms or reports of other illness at this time.  I think this is agreeable, we discussed and had shared medical decision making about length of evaluation and breath evaluation today in the ER and we will stay consistent with her husband's wishes who is her current medical decision maker.  ----------------------------------------- 8:44 AM on 07/04/2018 -----------------------------------------  Patient continues to do well, no distress.  Urinalysis no evidence of acute infection, culture is sent.  EKG unrevealing of any ischemia and her glucose is normal.  At this point will discharge, she can follow-up closely with the medical team that follows her at rehab.  Return precautions and treatment recommendations and follow-up provided for the patient who is agreeable with the plan.       ____________________________________________   FINAL CLINICAL IMPRESSION(S) / ED DIAGNOSES  Final diagnoses:  Traumatic hematoma of forehead, initial encounter  Fall, initial encounter  Face lacerations, initial encounter        Note:  This document was prepared using Dragon voice recognition software and may include unintentional dictation errors       Sharyn CreamerQuale, , MD 07/04/18 0845

## 2018-07-04 NOTE — ED Notes (Signed)
[facility] refused to transport pt - [facility] transporting

## 2018-07-04 NOTE — ED Triage Notes (Signed)
Per EMS patient is not suspected to have a cervical injury. She was brought in with a cervical collar per their protocol.

## 2018-07-05 LAB — URINE CULTURE
Culture: NO GROWTH
Special Requests: NORMAL

## 2018-11-08 ENCOUNTER — Emergency Department (HOSPITAL_COMMUNITY): Payer: Medicare Other

## 2018-11-08 ENCOUNTER — Other Ambulatory Visit: Payer: Self-pay

## 2018-11-08 ENCOUNTER — Encounter (HOSPITAL_COMMUNITY): Payer: Self-pay

## 2018-11-08 ENCOUNTER — Emergency Department (HOSPITAL_COMMUNITY)
Admission: EM | Admit: 2018-11-08 | Discharge: 2018-11-08 | Disposition: A | Discharge status: Home / Self Care | Payer: Medicare Other | Attending: Emergency Medicine | Admitting: Emergency Medicine

## 2018-11-08 DIAGNOSIS — Y92129 Unspecified place in nursing home as the place of occurrence of the external cause: Secondary | ICD-10-CM | POA: Diagnosis not present

## 2018-11-08 DIAGNOSIS — S0003XA Contusion of scalp, initial encounter: Secondary | ICD-10-CM | POA: Diagnosis not present

## 2018-11-08 DIAGNOSIS — S0181XA Laceration without foreign body of other part of head, initial encounter: Secondary | ICD-10-CM | POA: Diagnosis not present

## 2018-11-08 DIAGNOSIS — Z79899 Other long term (current) drug therapy: Secondary | ICD-10-CM | POA: Diagnosis not present

## 2018-11-08 DIAGNOSIS — F0390 Unspecified dementia without behavioral disturbance: Secondary | ICD-10-CM | POA: Insufficient documentation

## 2018-11-08 DIAGNOSIS — M25511 Pain in right shoulder: Secondary | ICD-10-CM | POA: Diagnosis not present

## 2018-11-08 DIAGNOSIS — Y999 Unspecified external cause status: Secondary | ICD-10-CM | POA: Diagnosis not present

## 2018-11-08 DIAGNOSIS — W1830XA Fall on same level, unspecified, initial encounter: Secondary | ICD-10-CM | POA: Insufficient documentation

## 2018-11-08 DIAGNOSIS — Y9389 Activity, other specified: Secondary | ICD-10-CM | POA: Diagnosis not present

## 2018-11-08 DIAGNOSIS — E039 Hypothyroidism, unspecified: Secondary | ICD-10-CM | POA: Diagnosis not present

## 2018-11-08 DIAGNOSIS — I10 Essential (primary) hypertension: Secondary | ICD-10-CM | POA: Diagnosis not present

## 2018-11-08 DIAGNOSIS — W19XXXA Unspecified fall, initial encounter: Secondary | ICD-10-CM

## 2018-11-08 LAB — BASIC METABOLIC PANEL
Anion gap: 11 (ref 5–15)
BUN: 14 mg/dL (ref 8–23)
CO2: 24 mmol/L (ref 22–32)
Calcium: 9.7 mg/dL (ref 8.9–10.3)
Chloride: 103 mmol/L (ref 98–111)
Creatinine, Ser: 0.73 mg/dL (ref 0.44–1.00)
GFR calc Af Amer: >60 mL/min (ref >60)
GFR calc non Af Amer: >60 mL/min (ref >60)
Glucose, Bld: 96 mg/dL (ref 70–99)
Potassium: 4 mmol/L (ref 3.5–5.1)
Sodium: 138 mmol/L (ref 135–145)

## 2018-11-08 LAB — CBC WITH DIFFERENTIAL/PLATELET
Abs Immature Granulocytes: 0.03 K/uL (ref 0.00–0.07)
Basophils Absolute: 0 K/uL (ref 0.0–0.1)
Basophils Relative: 0 %
Eosinophils Absolute: 0 K/uL (ref 0.0–0.5)
Eosinophils Relative: 0 %
HCT: 40.3 % (ref 36.0–46.0)
Hemoglobin: 13.4 g/dL (ref 12.0–15.0)
Immature Granulocytes: 0 %
Lymphocytes Relative: 19 %
Lymphs Abs: 1.7 K/uL (ref 0.7–4.0)
MCH: 30.5 pg (ref 26.0–34.0)
MCHC: 33.3 g/dL (ref 30.0–36.0)
MCV: 91.6 fL (ref 80.0–100.0)
Monocytes Absolute: 0.8 K/uL (ref 0.1–1.0)
Monocytes Relative: 9 %
Neutro Abs: 6.5 K/uL (ref 1.7–7.7)
Neutrophils Relative %: 72 %
Platelets: 278 K/uL (ref 150–400)
RBC: 4.4 MIL/uL (ref 3.87–5.11)
RDW: 12.6 % (ref 11.5–15.5)
WBC: 9.1 K/uL (ref 4.0–10.5)
nRBC: 0 % (ref 0.0–0.2)

## 2018-11-08 MED ORDER — LIDOCAINE HCL 2 % IJ SOLN
10.0000 mL | Freq: Once | INTRAMUSCULAR | Status: AC
  Administered 2018-11-08: 200 mg
  Filled 2018-11-08: qty 20

## 2018-11-08 NOTE — ED Triage Notes (Addendum)
Pt transported by [facility] from [facility], pt found on floor by roommate @ [phone], Staff called EMS, lac above L eye with swelling and bruising noted.  unkn LOC. EMS concerned @ injury to R shoulder, No blood thinners found on MAR.

## 2018-11-08 NOTE — ED Provider Notes (Signed)
[facility] EMERGENCY DEPARTMENT Provider Note   CSN: [id number] Arrival date & time: 11/08/18  3244     History   Chief Complaint Chief Complaint  Patient presents with   Fall    HPI Leah Jacobs is a 73 y.o. female.  Presents emergency department from [facility] after unwitnessed fall.  Found on floor by roommate around 540 this morning, she is noted to have laceration above her left eyebrow with significant swelling and bruising EMS also reported concern for a right shoulder injury, no known other injuries or deformities were noted prior to arrival.  LOC unknown, no known blood thinners.  History limited due to patient baseline mental status. Per chart review, history for a aphasia, stroke, resides at [facility].     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      Home Medications    Prior to Admission medications   Medication Sig Start Date End Date Taking? Authorizing Provider  amLODipine (NORVASC) 10 MG tablet Take 10 mg by mouth daily. 03/17/18   [provider]  levothyroxine (SYNTHROID, LEVOTHROID) 75 MCG tablet Take 75 mcg by mouth daily before breakfast. 01/28/18   [provider]  lisinopril (PRINIVIL,ZESTRIL) 20 MG tablet Take 20 mg by mouth daily. 03/14/18   [provider]  meclizine (ANTIVERT)  12.5 MG tablet Take 12.5 mg by mouth 3 (three) times daily as needed for dizziness. 08/25/17   [provider]  venlafaxine XR (EFFEXOR-XR) 150 MG 24 hr capsule Take 1 capsule (150 mg total) by mouth daily. 04/24/18   Enedina Finner, MD  Vitamin D, Ergocalciferol, (DRISDOL) 1.25 MG (50000 UT) CAPS capsule Take 50,000 Units by mouth once a week. 04/19/18   [provider]    Family History No family history on file.  Social History Social History   Tobacco Use   Smoking status: Never Smoker   Smokeless tobacco: Never Used  Substance Use Topics   Alcohol use: Yes   Drug use: No     Allergies   Sulfa antibiotics, Gadolinium derivatives, and Neosporin [neomycin-bacitracin zn-polymyx]   Review of Systems Review of Systems  Unable to perform ROS: Dementia     Physical Exam Updated Vital Signs BP (!) 107/58    Pulse 65    Resp 18    Ht 5\' 2"  (1.575 m)    Wt 54.4 kg    SpO2 100%    BMI 21.95 kg/m   Physical Exam Constitutional:      Comments: Elderly, lying in bed in no acute distress  HENT:     Head: Normocephalic.     Comments: 3 cm laceration horizontal, superior to left eyebrow, no active bleeding    Right Ear: Tympanic membrane normal.     Left Ear: Tympanic membrane normal.     Nose: Nose normal.  Mouth/Throat:     Mouth: Mucous membranes are dry.  Eyes:     Extraocular Movements: Extraocular movements intact.     Pupils: Pupils are equal, round, and reactive to light.  Neck:     Comments: C-collar in place, no deformity or tenderness noted over C-spine Cardiovascular:     Rate and Rhythm: Normal rate and regular rhythm.     Pulses: Normal pulses.     Heart sounds: Normal heart sounds.  Pulmonary:     Effort: Pulmonary effort is normal.     Breath sounds: Normal breath sounds.  Abdominal:     General: Abdomen is flat.     Palpations: Abdomen is soft.     Comments: No trauma  Musculoskeletal:     Comments: No deformity noted over T or  L-spine, question subtle tenderness in the lower L-spine RUE: No deformity, edema, tenderness or ecchymosis noted throughout extremity, distal pulses intact LUE: No deformity, edema, tenderness or ecchymosis noted throughout extremity, distal pulses intact RLE: No deformity, edema, tenderness or ecchymosis noted throughout extremity, distal pulses intact LLE: No deformity, edema, tenderness or ecchymosis noted throughout extremity, distal pulses intact  Skin:    General: Skin is warm and dry.     Capillary Refill: Capillary refill takes less than 2 seconds.  Neurological:     Comments: Opens eyes, follows some commands, moves all 4 extremities spontaneously, makes no meaningful verbal words      ED Treatments / Results  Labs (all labs ordered are listed, but only abnormal results are displayed) Labs Reviewed  CBC WITH DIFFERENTIAL/PLATELET  BASIC METABOLIC PANEL    EKG None  Radiology Dg Chest 1 View  Result Date: 11/08/2018 CLINICAL DATA:  Unwitnessed fall at SNF  PATIENT IS NON VERBAL EXAM: CHEST  1 VIEW COMPARISON:  Chest radiograph dated 11/08/2018 FINDINGS: Stable cardiomediastinal contours with mildly enlarged heart size. No new focal pulmonary opacity. No pneumothorax or large pleural effusion. Degenerative changes are seen in the thoracic spine. IMPRESSION: No acute cardiopulmonary process. Electronically Signed   By: Emmaline KluverNancy  Ballantyne M.D.   On: 11/08/2018 08:37   Dg Lumbar Spine Complete  Result Date: 11/08/2018 CLINICAL DATA:  Unwitnessed fall at SNF  PATIENT IS NON VERBAL EXAM: LUMBAR SPINE - COMPLETE 4+ VIEW COMPARISON:  None. FINDINGS: There are 5 non-rib-bearing lumbar type vertebral bodies. Minimal retrolisthesis of L3 on L4 and anterolisthesis of L4 on L5 likely on a degenerative basis. Otherwise spinal alignment intact. Vertebral body heights are maintained. There is mild intervertebral disc space loss at L1-2, L2-3 and L5-S1. There is associated anterior spurring.  Lower lumbar facet hypertrophy. Lung bases are clear. Nonobstructive bowel gas pattern. IMPRESSION: No evidence of acute fracture or static subluxation on radiographs of the lumbar spine. Electronically Signed   By: Emmaline KluverNancy  Ballantyne M.D.   On: 11/08/2018 08:31   Dg Pelvis 1-2 Views  Result Date: 11/08/2018 CLINICAL DATA:  Unwitnessed fall at SNF PATIENT IS NON VERBAL EXAM: PELVIS - 1-2 VIEW COMPARISON:  Pelvis radiographs 04/20/2018 FINDINGS: There is no evidence of pelvic fracture or diastasis. No evidence of dislocation. No pelvic bone lesions are seen. Moderate degenerative changes in the bilateral hips. IMPRESSION: No radiographic evidence of fracture or dislocation in the pelvis. Electronically Signed   By: Emmaline KluverNancy  Ballantyne M.D.   On: 11/08/2018 08:25   Dg Shoulder Right  Result Date: 11/08/2018 CLINICAL DATA:  Fall. Right shoulder injury and pain. Initial encounter. EXAM: RIGHT SHOULDER - 2+ VIEW COMPARISON:  None.  FINDINGS: No evidence of acute fracture or dislocation. Generalized osteopenia noted. Degenerative spurring is seen involving the acromioclavicular joint, without significant joint space narrowing. No other bone lesions identified. IMPRESSION: 1. No acute findings. 2. Degenerative spurring of acromioclavicular joint. Electronically Signed   By: Danae OrleansJohn A Stahl M.D.   On: 11/08/2018 08:36   Ct Head Wo Contrast  Result Date: 11/08/2018 CLINICAL DATA:  Head trauma. Laceration. Hematoma left forehead. Fall from a standing position. Patient found on floor. Unknown loss of consciousness. EXAM: CT HEAD WITHOUT CONTRAST CT CERVICAL SPINE WITHOUT CONTRAST TECHNIQUE: Multidetector CT imaging of the head and cervical spine was performed following the standard protocol without intravenous contrast. Multiplanar CT image reconstructions of the cervical spine were also generated. COMPARISON:  CT head and cervical 07/04/2018 and 04/20/2018. MRI head without contrast 04/21/2018 FINDINGS: CT HEAD FINDINGS  Brain: No acute infarct, hemorrhage, or mass lesion is present. Mild atrophy and white matter changes are stable. White matter changes are most prominent within the posterior limb of the internal capsule bilaterally. No acute cortical infarct is present. No acute hemorrhage or mass lesion is present. The ventricles are proportionate to the degree of atrophy. No significant extraaxial fluid collection is present. Vascular: No hyperdense vessel or unexpected calcification. Skull: Left supraorbital scalp hematoma and laceration is present. There is no radiopaque foreign body. No underlying fracture is present. Calvarium is intact. Sinuses/Orbits: The paranasal sinuses and mastoid air cells are clear. The globes and orbits are within normal limits. CT CERVICAL SPINE FINDINGS Alignment: There is straightening of the normal cervical lordosis. Grade 1 anterolisthesis at C3-4, C4-5, C7-T1, and T1-2 is stable. Skull base and vertebrae: Craniocervical junction is normal. Vertebral body heights are preserved. No acute fracture or traumatic subluxation is present. Soft tissues and spinal canal: No prevertebral fluid or swelling. No visible canal hematoma. Disc levels: Multilevel degenerative changes are again noted. Cervical stenosis is greatest on the left C5-6 and C6-7 due to uncovertebral and facet disease. Asymmetric right-sided facet hypertrophy is evident at C3-4 and C4-5. Upper chest: The lung apices are clear.  Thoracic inlet is normal. IMPRESSION: 1. Prominent left supraorbital scalp hematoma and laceration without underlying fracture. 2. Stable atrophy and white matter disease. This likely reflects the sequela of chronic microvascular ischemia. 3. No acute intracranial abnormality. 4. Multilevel degenerative changes of the cervical spine are stable. No acute fracture or traumatic subluxation. Electronically Signed   By: Marin Robertshristopher  Mattern M.D.   On: 11/08/2018 08:48   Ct Cervical Spine Wo Contrast  Result Date:  11/08/2018 CLINICAL DATA:  Head trauma. Laceration. Hematoma left forehead. Fall from a standing position. Patient found on floor. Unknown loss of consciousness. EXAM: CT HEAD WITHOUT CONTRAST CT CERVICAL SPINE WITHOUT CONTRAST TECHNIQUE: Multidetector CT imaging of the head and cervical spine was performed following the standard protocol without intravenous contrast. Multiplanar CT image reconstructions of the cervical spine were also generated. COMPARISON:  CT head and cervical 07/04/2018 and 04/20/2018. MRI head without contrast 04/21/2018 FINDINGS: CT HEAD FINDINGS Brain: No acute infarct, hemorrhage, or mass lesion is present. Mild atrophy and white matter changes are stable. White matter changes are most prominent within the posterior limb of the internal capsule bilaterally. No acute cortical infarct is present. No acute hemorrhage or mass lesion is present. The ventricles are proportionate to the degree of atrophy. No significant extraaxial fluid collection is present. Vascular: No hyperdense vessel or unexpected calcification. Skull: Left supraorbital scalp hematoma and laceration is present. There is no radiopaque foreign  body. No underlying fracture is present. Calvarium is intact. Sinuses/Orbits: The paranasal sinuses and mastoid air cells are clear. The globes and orbits are within normal limits. CT CERVICAL SPINE FINDINGS Alignment: There is straightening of the normal cervical lordosis. Grade 1 anterolisthesis at C3-4, C4-5, C7-T1, and T1-2 is stable. Skull base and vertebrae: Craniocervical junction is normal. Vertebral body heights are preserved. No acute fracture or traumatic subluxation is present. Soft tissues and spinal canal: No prevertebral fluid or swelling. No visible canal hematoma. Disc levels: Multilevel degenerative changes are again noted. Cervical stenosis is greatest on the left C5-6 and C6-7 due to uncovertebral and facet disease. Asymmetric right-sided facet hypertrophy is evident  at C3-4 and C4-5. Upper chest: The lung apices are clear.  Thoracic inlet is normal. IMPRESSION: 1. Prominent left supraorbital scalp hematoma and laceration without underlying fracture. 2. Stable atrophy and white matter disease. This likely reflects the sequela of chronic microvascular ischemia. 3. No acute intracranial abnormality. 4. Multilevel degenerative changes of the cervical spine are stable. No acute fracture or traumatic subluxation. Electronically Signed   By: Marin Roberts M.D.   On: 11/08/2018 08:48    Procedures .Marland KitchenLaceration Repair  Date/Time: 11/08/2018 9:45 AM Performed by: Milagros Loll, MD Authorized by: Milagros Loll, MD   Anesthesia (see MAR for exact dosages):    Anesthesia method:  Local infiltration   Local anesthetic:  Lidocaine 2% w/o epi Laceration details:    Location:  Face   Face location:  Forehead   Length (cm):  3 Repair type:    Repair type:  Simple Exploration:    Hemostasis achieved with:  Direct pressure   Contaminated: no   Treatment:    Area cleansed with:  Betadine   Amount of cleaning:  Extensive   Irrigation solution:  Sterile saline   Irrigation method:  Syringe Skin repair:    Repair method:  Sutures   Suture size:  5-0   Suture technique:  Simple interrupted   Number of sutures:  5 Approximation:    Approximation:  Close Post-procedure details:    Dressing: gauze.   Patient tolerance of procedure:  Tolerated well, no immediate complications   (including critical care time)  Medications Ordered in ED Medications  lidocaine (XYLOCAINE) 2 % (with pres) injection 200 mg (has no administration in time range)     Initial Impression / Assessment and Plan / ED Course  I have reviewed the triage vital signs and the nursing notes.  Pertinent labs & imaging results that were available during my care of the patient were reviewed by me and considered in my medical decision making (see chart for details).  Clinical  Course as of Nov 08 946  Mon Nov 08, 2018  0730 Completed initial assessment   [RD]  0754 Attempted to call emergency contact listed on facesheet - no answer, left VM to call us back in ER   [RD]  0944 Finish laceration repair, remains well-appearing, will discharge home   [RD]  [phone] Notified by RN, able to verified by facility patient is at baseline mental status   [RD]    Clinical Course User Index [RD] Milagros Loll, MD      73 year old lady who presented from nursing home after unwitnessed fall.  On physical examination, concern for left forehead laceration, no other significant abnormalities were appreciated.  Question mild L-spine tenderness however no deformity or ecchymosis was seen.  There was report of concern for right shoulder pain however did  not note any shoulder abnormalities.  CT head, C-spine negative.  X-ray shoulder and L-spine negative.  Labs within normal limits, EKG without acute ischemic changes.  Performed primary closure of forehead laceration.  Attempted to reach patient's husband however unsuccessful.  Confirmed with facility patient has been at baseline mental status.  Will discharge back to facility.    After the discussed management above, the patient was determined to be safe for discharge.  The patient was in agreement with this plan and all questions regarding their care were answered.  ED return precautions were discussed and the patient will return to the ED with any significant worsening of condition.   Final Clinical Impressions(s) / ED Diagnoses   Final diagnoses:  Fall, initial encounter  Laceration of forehead, initial encounter  Hematoma of scalp, initial encounter    ED Discharge Orders    None       Lucrezia Starch, MD 11/08/18 [phone]

## 2018-11-08 NOTE — ED Notes (Signed)
ED Provider at bedside. 

## 2018-11-08 NOTE — ED Notes (Signed)
REport given to [facility] pt transported return to NH via [place].

## 2018-11-08 NOTE — Discharge Instructions (Signed)
Please keep the wound clean and dry, change dressings every 1 to 2 days.  Sutures will need to be removed in 5 to 7 days.  If the area becomes red, warm, pus drainage, fever, please return to ER for reassessment.  If patient develops worsening mental status, increased confusion or difficulty breathing or chest pain, please return to ER for recheck.  Can follow-up with PCP for suture removal or provider at facility.

## 2018-11-08 NOTE — ED Notes (Signed)
Raeford Razor contacted at [facility] and state that pt baseline is non verbal with some movement of extremities and some follow command. Report given on pt and Norvel Richards states to contact [facility] for return transport. Same reported to Dr. Roslynn Amble.

## 2018-11-08 NOTE — ED Notes (Signed)
[facility] called for tranpsport

## 2019-02-23 ENCOUNTER — Inpatient Hospital Stay (HOSPITAL_COMMUNITY)
Admission: EM | Admit: 2019-02-23 | Discharge: 2019-02-27 | DRG: 682 | Disposition: A | Discharge status: Skilled Nursing Facility | Payer: Medicare Other | Source: Skilled Nursing Facility | Attending: Family Medicine | Admitting: Family Medicine

## 2019-02-23 ENCOUNTER — Emergency Department (HOSPITAL_COMMUNITY): Payer: Medicare Other

## 2019-02-23 DIAGNOSIS — Z883 Allergy status to other anti-infective agents status: Secondary | ICD-10-CM

## 2019-02-23 DIAGNOSIS — Z20828 Contact with and (suspected) exposure to other viral communicable diseases: Secondary | ICD-10-CM | POA: Diagnosis present

## 2019-02-23 DIAGNOSIS — G3101 Pick's disease: Secondary | ICD-10-CM | POA: Diagnosis present

## 2019-02-23 DIAGNOSIS — Z79890 Hormone replacement therapy (postmenopausal): Secondary | ICD-10-CM | POA: Diagnosis not present

## 2019-02-23 DIAGNOSIS — H903 Sensorineural hearing loss, bilateral: Secondary | ICD-10-CM | POA: Diagnosis present

## 2019-02-23 DIAGNOSIS — F329 Major depressive disorder, single episode, unspecified: Secondary | ICD-10-CM | POA: Diagnosis present

## 2019-02-23 DIAGNOSIS — G4733 Obstructive sleep apnea (adult) (pediatric): Secondary | ICD-10-CM | POA: Diagnosis present

## 2019-02-23 DIAGNOSIS — F0280 Dementia in other diseases classified elsewhere without behavioral disturbance: Secondary | ICD-10-CM | POA: Diagnosis present

## 2019-02-23 DIAGNOSIS — N390 Urinary tract infection, site not specified: Secondary | ICD-10-CM | POA: Diagnosis present

## 2019-02-23 DIAGNOSIS — B9620 Unspecified Escherichia coli [E. coli] as the cause of diseases classified elsewhere: Secondary | ICD-10-CM | POA: Diagnosis present

## 2019-02-23 DIAGNOSIS — I10 Essential (primary) hypertension: Secondary | ICD-10-CM | POA: Diagnosis present

## 2019-02-23 DIAGNOSIS — E860 Dehydration: Secondary | ICD-10-CM | POA: Diagnosis present

## 2019-02-23 DIAGNOSIS — U071 COVID-19: Secondary | ICD-10-CM | POA: Diagnosis present

## 2019-02-23 DIAGNOSIS — B961 Klebsiella pneumoniae [K. pneumoniae] as the cause of diseases classified elsewhere: Secondary | ICD-10-CM | POA: Diagnosis present

## 2019-02-23 DIAGNOSIS — D72829 Elevated white blood cell count, unspecified: Secondary | ICD-10-CM | POA: Diagnosis present

## 2019-02-23 DIAGNOSIS — N179 Acute kidney failure, unspecified: Secondary | ICD-10-CM | POA: Diagnosis present

## 2019-02-23 DIAGNOSIS — L89152 Pressure ulcer of sacral region, stage 2: Secondary | ICD-10-CM | POA: Diagnosis present

## 2019-02-23 DIAGNOSIS — R4182 Altered mental status, unspecified: Secondary | ICD-10-CM | POA: Diagnosis present

## 2019-02-23 DIAGNOSIS — Z882 Allergy status to sulfonamides status: Secondary | ICD-10-CM

## 2019-02-23 DIAGNOSIS — E039 Hypothyroidism, unspecified: Secondary | ICD-10-CM | POA: Diagnosis present

## 2019-02-23 DIAGNOSIS — Z8659 Personal history of other mental and behavioral disorders: Secondary | ICD-10-CM

## 2019-02-23 DIAGNOSIS — I444 Left anterior fascicular block: Secondary | ICD-10-CM | POA: Diagnosis present

## 2019-02-23 DIAGNOSIS — E878 Other disorders of electrolyte and fluid balance, not elsewhere classified: Secondary | ICD-10-CM | POA: Diagnosis present

## 2019-02-23 DIAGNOSIS — G9341 Metabolic encephalopathy: Secondary | ICD-10-CM | POA: Diagnosis present

## 2019-02-23 DIAGNOSIS — I6930 Unspecified sequelae of cerebral infarction: Secondary | ICD-10-CM | POA: Diagnosis not present

## 2019-02-23 DIAGNOSIS — Z888 Allergy status to other drugs, medicaments and biological substances status: Secondary | ICD-10-CM

## 2019-02-23 DIAGNOSIS — I69320 Aphasia following cerebral infarction: Secondary | ICD-10-CM

## 2019-02-23 DIAGNOSIS — I82409 Acute embolism and thrombosis of unspecified deep veins of unspecified lower extremity: Secondary | ICD-10-CM | POA: Diagnosis not present

## 2019-02-23 DIAGNOSIS — Z66 Do not resuscitate: Secondary | ICD-10-CM | POA: Diagnosis not present

## 2019-02-23 DIAGNOSIS — Z9089 Acquired absence of other organs: Secondary | ICD-10-CM

## 2019-02-23 DIAGNOSIS — L8990 Pressure ulcer of unspecified site, unspecified stage: Secondary | ICD-10-CM | POA: Insufficient documentation

## 2019-02-23 DIAGNOSIS — F0390 Unspecified dementia without behavioral disturbance: Secondary | ICD-10-CM | POA: Diagnosis not present

## 2019-02-23 DIAGNOSIS — E870 Hyperosmolality and hypernatremia: Secondary | ICD-10-CM | POA: Diagnosis present

## 2019-02-23 DIAGNOSIS — Z79899 Other long term (current) drug therapy: Secondary | ICD-10-CM

## 2019-02-23 LAB — CBC WITH DIFFERENTIAL/PLATELET
Abs Immature Granulocytes: 0.06 10*3/uL (ref 0.00–0.07)
Basophils Absolute: 0 10*3/uL (ref 0.0–0.1)
Basophils Relative: 0 %
Eosinophils Absolute: 0 10*3/uL (ref 0.0–0.5)
Eosinophils Relative: 0 %
HCT: 53 % — ABNORMAL HIGH (ref 36.0–46.0)
Hemoglobin: 16.6 g/dL — ABNORMAL HIGH (ref 12.0–15.0)
Immature Granulocytes: 0 %
Lymphocytes Relative: 13 %
Lymphs Abs: 1.8 10*3/uL (ref 0.7–4.0)
MCH: 30.3 pg (ref 26.0–34.0)
MCHC: 31.3 g/dL (ref 30.0–36.0)
MCV: 96.7 fL (ref 80.0–100.0)
Monocytes Absolute: 1.1 10*3/uL — ABNORMAL HIGH (ref 0.1–1.0)
Monocytes Relative: 8 %
Neutro Abs: 10.7 10*3/uL — ABNORMAL HIGH (ref 1.7–7.7)
Neutrophils Relative %: 79 %
Platelets: 298 10*3/uL (ref 150–400)
RBC: 5.48 MIL/uL — ABNORMAL HIGH (ref 3.87–5.11)
RDW: 13.5 % (ref 11.5–15.5)
WBC: 13.6 10*3/uL — ABNORMAL HIGH (ref 4.0–10.5)
nRBC: 0 % (ref 0.0–0.2)

## 2019-02-23 LAB — COMPREHENSIVE METABOLIC PANEL
ALT: 103 U/L — ABNORMAL HIGH (ref 0–44)
AST: 57 U/L — ABNORMAL HIGH (ref 15–41)
Albumin: 4.4 g/dL (ref 3.5–5.0)
Alkaline Phosphatase: 85 U/L (ref 38–126)
Anion gap: 14 (ref 5–15)
BUN: 82 mg/dL — ABNORMAL HIGH (ref 8–23)
CO2: 21 mmol/L — ABNORMAL LOW (ref 22–32)
Calcium: 10 mg/dL (ref 8.9–10.3)
Chloride: 120 mmol/L — ABNORMAL HIGH (ref 98–111)
Creatinine, Ser: 1.43 mg/dL — ABNORMAL HIGH (ref 0.44–1.00)
GFR calc Af Amer: 41 mL/min — ABNORMAL LOW (ref >60)
GFR calc non Af Amer: 36 mL/min — ABNORMAL LOW (ref >60)
Glucose, Bld: 110 mg/dL — ABNORMAL HIGH (ref 70–99)
Potassium: 4.6 mmol/L (ref 3.5–5.1)
Sodium: 155 mmol/L — ABNORMAL HIGH (ref 135–145)
Total Bilirubin: 1.7 mg/dL — ABNORMAL HIGH (ref 0.3–1.2)
Total Protein: 7.8 g/dL (ref 6.5–8.1)

## 2019-02-23 LAB — LACTATE DEHYDROGENASE: LDH: 283 U/L — ABNORMAL HIGH (ref 98–192)

## 2019-02-23 LAB — URINALYSIS, ROUTINE W REFLEX MICROSCOPIC
Bilirubin Urine: NEGATIVE
Glucose, UA: NEGATIVE mg/dL
Ketones, ur: NEGATIVE mg/dL
Nitrite: NEGATIVE
Protein, ur: NEGATIVE mg/dL
Specific Gravity, Urine: 1.02 (ref 1.005–1.030)
pH: 5 (ref 5.0–8.0)

## 2019-02-23 LAB — FERRITIN: Ferritin: 447 ng/mL — ABNORMAL HIGH (ref 11–307)

## 2019-02-23 LAB — CBG MONITORING, ED: Glucose-Capillary: 118 mg/dL — ABNORMAL HIGH (ref 70–99)

## 2019-02-23 LAB — LACTIC ACID, PLASMA
Lactic Acid, Venous: 1.6 mmol/L (ref 0.5–1.9)
Lactic Acid, Venous: 1.5 mmol/L (ref 0.5–1.9)

## 2019-02-23 LAB — PROCALCITONIN: Procalcitonin: <0.1 ng/mL

## 2019-02-23 LAB — RESPIRATORY PANEL BY RT PCR (FLU A&B, COVID)
Influenza A by PCR: NEGATIVE
Influenza B by PCR: NEGATIVE
SARS Coronavirus 2 by RT PCR: POSITIVE — AB

## 2019-02-23 LAB — FIBRINOGEN: Fibrinogen: 469 mg/dL (ref 210–475)

## 2019-02-23 LAB — C-REACTIVE PROTEIN: CRP: <0.5 mg/dL (ref <1.0)

## 2019-02-23 LAB — ABO/RH: ABO/RH(D): A NEG

## 2019-02-23 LAB — D-DIMER, QUANTITATIVE: D-Dimer, Quant: 10.15 ug/mL-FEU — ABNORMAL HIGH (ref 0.00–0.50)

## 2019-02-23 LAB — POC SARS CORONAVIRUS 2 AG -  ED: SARS Coronavirus 2 Ag: NEGATIVE

## 2019-02-23 LAB — TRIGLYCERIDES: Triglycerides: 156 mg/dL — ABNORMAL HIGH (ref <150)

## 2019-02-23 MED ORDER — ALBUTEROL SULFATE HFA 108 (90 BASE) MCG/ACT IN AERS
2.0000 | INHALATION_SPRAY | Freq: Four times a day (QID) | RESPIRATORY_TRACT | Status: DC | PRN
  Filled 2019-02-23: qty 6.7

## 2019-02-23 MED ORDER — ALBUTEROL SULFATE (2.5 MG/3ML) 0.083% IN NEBU: 2.5000 mg | INHALATION_SOLUTION | Freq: Four times a day (QID) | RESPIRATORY_TRACT | Status: DC | PRN

## 2019-02-23 MED ORDER — SODIUM CHLORIDE 0.9 % IV SOLN: Freq: Once | INTRAVENOUS | Status: DC

## 2019-02-23 MED ORDER — SODIUM CHLORIDE 0.9 % IV BOLUS
1000.0000 mL | Freq: Once | INTRAVENOUS | Status: AC
  Administered 2019-02-23: 1000 mL via INTRAVENOUS

## 2019-02-23 MED ORDER — SODIUM CHLORIDE 0.9 % IV SOLN
1.0000 g | Freq: Once | INTRAVENOUS | Status: AC
  Administered 2019-02-23: 1 g via INTRAVENOUS
  Filled 2019-02-23: qty 10

## 2019-02-23 MED ORDER — FAMOTIDINE IN NACL 20-0.9 MG/50ML-% IV SOLN
20.0000 mg | INTRAVENOUS | Status: DC
  Administered 2019-02-23 – 2019-02-25 (×3): 20 mg via INTRAVENOUS
  Filled 2019-02-23 (×4): qty 50

## 2019-02-23 MED ORDER — ENOXAPARIN SODIUM 30 MG/0.3ML ~~LOC~~ SOLN
30.0000 mg | SUBCUTANEOUS | Status: DC
  Administered 2019-02-23 – 2019-02-24 (×2): 30 mg via SUBCUTANEOUS
  Filled 2019-02-23: qty 0.3

## 2019-02-23 MED ORDER — DEXTROSE 5 % IV SOLN: INTRAVENOUS | Status: DC

## 2019-02-23 MED ORDER — SODIUM CHLORIDE 0.9 % IV SOLN
1.0000 g | INTRAVENOUS | Status: DC
  Administered 2019-02-24: 1 g via INTRAVENOUS
  Filled 2019-02-23: qty 10

## 2019-02-23 MED ORDER — ONDANSETRON HCL 4 MG PO TABS: 4.0000 mg | ORAL_TABLET | Freq: Four times a day (QID) | ORAL | Status: DC | PRN

## 2019-02-23 MED ORDER — ONDANSETRON HCL 4 MG/2ML IJ SOLN: 4.0000 mg | Freq: Four times a day (QID) | INTRAMUSCULAR | Status: DC | PRN

## 2019-02-23 MED ORDER — SODIUM CHLORIDE 0.9% FLUSH
3.0000 mL | Freq: Two times a day (BID) | INTRAVENOUS | Status: DC
  Administered 2019-02-25 – 2019-02-27 (×5): 3 mL via INTRAVENOUS

## 2019-02-23 NOTE — ED Notes (Signed)
LM on husband's phone (per info in the chart.

## 2019-02-23 NOTE — ED Triage Notes (Signed)
Pt here via [facility] from a memory care unit. Per [facility] she is usually ambulatory and feeds herself. She is positive for Covid and has not eaten in 2 days.  Pt is non-verbal.  Stating off to the right.

## 2019-02-23 NOTE — H&P (Addendum)
History and Physical    Leah Jacobs ZYS:[id number] DOB: 1945-07-09 DOA: 02/23/2019  Referring MD/NP/PA: Lennice Sites, MD PCP: [facility]  Patient coming from: Nursing facility via EMS  Chief Complaint: Altered mental status  I have personally briefly reviewed patient's old medical records in [place]   HPI: Leah Jacobs is a 73 y.o. female with medical history significant of hypertension, CVA with residual deficits, and hypothyroidism.  History is obtained from review of records as patient is currently altered and aphasic at baseline.  She presents after having 2 days of not eating.  Normally patient is ambulatory at baseline and have been staying in the Covid unit since testing positive for the virus on December 4.  She had become less responsive and was not tracking with her eyes like she previously was seen to have been able to do.  ED Course: Upon admission to the emergency department patient was seen to have stable vital signs.  Labs significant for WBC 13.6, hemoglobin 16.6, sodium 155, BUN 82, creatinine 1.43, AST 57, ALT 103, and total bilirubin 1.7.  Chest x-ray did not show any acute abnormalities.  Urinalysis was positive for moderate leukocytes, many bacteria, and 11-20 WBCs.  Patient was empirically started on antibiotics of Rocephin and 1 L normal saline IV fluids.  Point-of-care COVID-19 screening was negative.  Review of Systems  Unable to perform ROS: Patient nonverbal    Past Medical History:  Diagnosis Date  . Hypertension   . Stroke ([place])   . Thyroid disease     Past Surgical History:  Procedure Laterality Date  . TONSILLECTOMY       reports that she has never smoked. She has never used smokeless tobacco. She reports current alcohol use. She reports that she does not use drugs.  Allergies  Allergen Reactions  . Sulfa Antibiotics Hives  . Gadolinium Derivatives     Unknown; pt does not remember reaction  . Neosporin  [Neomycin-Bacitracin Zn-Polymyx] Rash    Family history is unknown at this time.  Prior to Admission medications   Medication Sig Start Date End Date Taking? Authorizing Provider  amLODipine (NORVASC) 10 MG tablet Take 10 mg by mouth daily. 03/17/18   [provider]  levothyroxine (SYNTHROID, LEVOTHROID) 75 MCG tablet Take 75 mcg by mouth daily before breakfast. 01/28/18   [provider]  lisinopril (PRINIVIL,ZESTRIL) 20 MG tablet Take 20 mg by mouth daily. 03/14/18   [provider]  meclizine (ANTIVERT) 12.5 MG tablet Take 12.5 mg by mouth 3 (three) times daily as needed for dizziness. 08/25/17   [provider]  venlafaxine XR (EFFEXOR-XR) 150 MG 24 hr capsule Take 1 capsule (150 mg total) by mouth daily. 04/24/18   Fritzi Mandes, MD  Vitamin D, Ergocalciferol, (DRISDOL) 1.25 MG (50000 UT) CAPS capsule Take 50,000 Units by mouth once a week. 04/19/18   [provider]    Physical Exam:  Constitutional: Elderly female who was not responding to verbal commands. Vitals:   02/23/19 1229 02/23/19 1300  BP: 109/69 113/66  Pulse: 74 71  Resp: (!) 21 20  Temp: 98.8 F (37.1 C)   TempSrc: Axillary   SpO2: 97% 94%   Eyes: Blinks to threat.  Right word gaze preference ENMT: Mucous membranes are dry. Posterior pharynx clear of any exudate or lesions.  Neck: normal, supple, no masses, no thyromegaly Respiratory: clear to auscultation bilaterally, no wheezing, no crackles. Normal respiratory effort. No accessory muscle use.  Cardiovascular: Regular rate  and rhythm, no murmurs / rubs / gallops. No extremity edema. 2+ pedal pulses. No carotid bruits.  Abdomen: no tenderness, no masses palpated. No hepatosplenomegaly. Bowel sounds positive.  Musculoskeletal: no clubbing / cyanosis. No joint deformity upper and lower extremities. Good ROM, no contractures. Normal muscle tone.  Skin: no rashes, lesions, ulcers. No induration.  Poor skin turgor. Neurologic:  Patient is alert and can open eyes.  Slight movement to noxious stimuli of extremities. Psychiatric: Unable to assess at this time, but patient has known history of dementia.      Labs on Admission: I have personally reviewed following labs and imaging studies  CBC: Recent Labs  Lab 02/23/19 1328  WBC 13.6*  NEUTROABS 10.7*  HGB 16.6*  HCT 53.0*  MCV 96.7  PLT 298   Basic Metabolic Panel: Recent Labs  Lab 02/23/19 1328  NA 155*  K 4.6  CL 120*  CO2 21*  GLUCOSE 110*  BUN 82*  CREATININE 1.43*  CALCIUM 10.0   GFR: CrCl cannot be calculated (Unknown ideal weight.). Liver Function Tests: Recent Labs  Lab 02/23/19 1328  AST 57*  ALT 103*  ALKPHOS 85  BILITOT 1.7*  PROT 7.8  ALBUMIN 4.4   No results for input(s): LIPASE, AMYLASE in the last 168 hours. No results for input(s): AMMONIA in the last 168 hours. Coagulation Profile: No results for input(s): INR, PROTIME in the last 168 hours. Cardiac Enzymes: No results for input(s): CKTOTAL, CKMB, CKMBINDEX, TROPONINI in the last 168 hours. BNP (last 3 results) No results for input(s): PROBNP in the last 8760 hours. HbA1C: No results for input(s): HGBA1C in the last 72 hours. CBG: No results for input(s): GLUCAP in the last 168 hours. Lipid Profile: Recent Labs    02/23/19 1328  TRIG 156*   Thyroid Function Tests: No results for input(s): TSH, T4TOTAL, FREET4, T3FREE, THYROIDAB in the last 72 hours. Anemia Panel: Recent Labs    02/23/19 1328  FERRITIN 447*   Urine analysis:    Component Value Date/Time   COLORURINE AMBER (A) 02/23/2019 1328   APPEARANCEUR HAZY (A) 02/23/2019 1328   LABSPEC 1.020 02/23/2019 1328   PHURINE 5.0 02/23/2019 1328   GLUCOSEU NEGATIVE 02/23/2019 1328   HGBUR SMALL (A) 02/23/2019 1328   BILIRUBINUR NEGATIVE 02/23/2019 1328   KETONESUR NEGATIVE 02/23/2019 1328   PROTEINUR NEGATIVE 02/23/2019 1328   NITRITE NEGATIVE 02/23/2019 1328   LEUKOCYTESUR MODERATE (A) 02/23/2019  1328   Sepsis Labs: No results found for this or any previous visit (from the past 240 hour(s)).   Radiological Exams on Admission: CT Head Wo Contrast  Result Date: 02/23/2019 CLINICAL DATA:  Altered mental status.  COVID-19 positive EXAM: CT HEAD WITHOUT CONTRAST TECHNIQUE: Contiguous axial images were obtained from the base of the skull through the vertex without intravenous contrast. COMPARISON:  July 04, 2018 FINDINGS: Brain: Moderate atrophy is stable. There is no intracranial mass, hemorrhage, extra-axial fluid collection, or midline shift. There is patchy small vessel disease in the centra semiovale bilaterally. Small vessel disease is noted in each posterior limb internal capsule region. No acute infarct evident. Vascular: There is no hyperdense vessel. There are foci of carotid siphon arterial calcification. Skull: Bony calvarium appears intact. Sinuses/Orbits: There is opacification in each sphenoid sinus with air-fluid levels in each sphenoid sinus. There is opacification in the posterior left ethmoid region with multiple areas of ethmoid sinus mucosal thickening. Orbits appear symmetric bilaterally. Other: The mastoid air cells are clear. There is debris in each external  auditory canal, more notable on the left than on the right. IMPRESSION: Atrophy with supratentorial small vessel disease. No acute infarct evident. No mass or hemorrhage. There are foci of arterial vascular calcification. There are multiple foci of paranasal sinus disease, most notable in the sphenoid and left ethmoid regions. Probable cerumen in the external auditory canals, more on the left than on the right. Electronically Signed   By: Bretta Bang III M.D.   On: 02/23/2019 14:28   DG Chest Port 1 View  Result Date: 02/23/2019 CLINICAL DATA:  COVID positive EXAM: PORTABLE CHEST 1 VIEW COMPARISON:  11/08/2018 FINDINGS: Heart and mediastinal contours are within normal limits. No focal opacities or effusions. No  acute bony abnormality. IMPRESSION: Negative Electronically Signed   By: Charlett Nose M.D.   On: 02/23/2019 14:52    EKG: Independently reviewed.  Sinus rhythm at 60 bpm with left anterior fascicular block  Assessment/Plan Acute kidney injury secondary to dehydration: Patient baseline creatinine previously had been within normal limits at 0.7, but patient presents with creatinine elevated at 1.43 with BUN 82.  The elevated BUN to creatinine ratio suggest prerenal cause. -Admit to a MedSurg bed -D5 W at 100 mL/h -Recheck creatinine in a.m. -Avoid nephrotoxic agent  COVID-19 virus infection: Patient was initially diagnosed on 12/4 with COVID-19 and had been in the Covid unit at the nursing facility. -Continue airborne precautions -Focus COVID-19 order set utilized -Albuterol inhaler  Urinary tract infection: Acute.  Patient noted to have moderate leukocytes, bacteria, small hemoglobin, and 11-20 WBCs on urinalysis.  Patient had been empirically started on Rocephin. -Follow-up urine culture -Team empiric antibiotics of Rocephin  Leukocytosis: Acute.  Patient presented with WBC elevated 13.6.  Suspect likely related with above problems. -Follow-up blood cultures -Follow-up lactic acid -Recheck CBC in a.m.  Acute metabolic encephalopathy, history CVA with residual deficit: Patient noted to be less responsive with no p.o. intake over the last 2 days.  Normally able to ambulate and eat although aphasic from previous stroke.  CT scan of the brain did not show any acute signs of a infarct.  Physical exam reveals a rightward gaze preference. -Neurochecks -Speech therapy consult for swallowing evaluation -Consider further imaging with MRI of the brain, if symptoms do not improve  Hypernatremia: Acute.  On admission sodium 155.  Given clinical scenario patient be significantly dehydrated. -Serial monitoring of sodium levels -Continue IV fluids as noted above -Adjust fluids as  needed  Essential hypertension: Patient's blood pressure currently stable. -Hold lisinopril due to acute kidney injury -Restart amlodipine when able to take p.o.  History of dementia   Acquired hypothyroidism -Check TSH -Consider changing levothyroxine to IV if unable to pass swallow screen  GI prophylaxis: Pepcid IV DVT prophylaxis: Lovenox Code Status: full code per MOST form Family Communication: No family available at this time Disposition Plan: Likely back to skilled nursing facility once medically stable Consults called: None Admission status: inpatient   Clydie Braun MD [facility] Pager [phone]   If 7PM-7AM, please contact night-coverage [email or web address] Password [facility]  02/23/2019, 3:34 PM

## 2019-02-23 NOTE — ED Provider Notes (Signed)
[facility] EMERGENCY DEPARTMENT Provider Note   CSN: [id number] Arrival date & time: 02/23/19  1158     History Chief Complaint  Patient presents with   Altered Mental Status    covid +    Leah Jacobs is a 73 y.o. female.  Level 5 caveat due to dementia.  History is obtained from caregiver at nursing facility.  Patient tested positive for coronavirus on December 4.  Has been in the Covid unit.  Has stroke history with severe aphasia at baseline.  Has overall general chronic weakness throughout with dementia.  However over the last several days they have been unable to successfully feed patient.  She has become less responsive.  Vital signs have been normal with them but she no longer moves any of her extremities very well, does not track as well with her eyes that she normally does.  Patient is still full code.  The history is provided by a caregiver.  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     No family history on file.  Social History   Tobacco Use   Smoking status: Never Smoker   Smokeless tobacco: Never Used    Substance Use Topics   Alcohol use: Yes   Drug use: No    Home Medications Prior to Admission medications   Medication Sig Start Date End Date Taking? Authorizing Provider  amLODipine (NORVASC) 10 MG tablet Take 10 mg by mouth daily. 03/17/18   [provider]  levothyroxine (SYNTHROID, LEVOTHROID) 75 MCG tablet Take 75 mcg by mouth daily before breakfast. 01/28/18   [provider]  lisinopril (PRINIVIL,ZESTRIL) 20 MG tablet Take 20 mg by mouth daily. 03/14/18   [provider]  meclizine (ANTIVERT) 12.5 MG tablet Take 12.5 mg by mouth 3 (three) times daily as needed for dizziness. 08/25/17   [provider]  venlafaxine XR (EFFEXOR-XR) 150 MG 24 hr capsule Take 1 capsule (150 mg total) by mouth daily. 04/24/18   Enedina Finner, MD  Vitamin D, Ergocalciferol, (DRISDOL) 1.25 MG (50000 UT) CAPS capsule Take 50,000 Units by mouth once a week. 04/19/18   [provider]    Allergies    Sulfa antibiotics, Gadolinium derivatives, and Neosporin [neomycin-bacitracin zn-polymyx]  Review of Systems   Review of Systems  Unable to perform ROS: Dementia  Neurological: Positive for weakness.    Physical Exam Updated Vital Signs  ED Triage Vitals [02/23/19 1229]  Enc Vitals Group     BP 109/69     Pulse Rate 74     Resp (!) 21     Temp 98.8 F (  37.1 C)     Temp Source Axillary     SpO2 97 %     Weight      Height      Head Circumference      Peak Flow      Pain Score      Pain Loc      Pain Edu?      Excl. in GC?     Physical Exam Vitals and nursing note reviewed.  Constitutional:      General: She is not in acute distress.    Appearance: She is underweight. She is not ill-appearing.  HENT:     Head: Normocephalic and atraumatic.     Mouth/Throat:     Mouth: Mucous membranes are dry.  Eyes:     Conjunctiva/sclera: Conjunctivae normal.     Pupils: Pupils are equal, round, and reactive to light.  Cardiovascular:     Rate and Rhythm:  Normal rate and regular rhythm.     Pulses: Normal pulses.     Heart sounds: Normal heart sounds. No murmur.  Pulmonary:     Effort: Pulmonary effort is normal. No respiratory distress.     Breath sounds: Normal breath sounds.  Abdominal:     Palpations: Abdomen is soft.     Tenderness: There is no abdominal tenderness.  Musculoskeletal:     Cervical back: Neck supple.  Skin:    General: Skin is warm and dry.     Capillary Refill: Capillary refill takes less than 2 seconds.  Neurological:     Mental Status: She is alert.     Comments: Patient opens eyes spontaneously, does not follow commands, minimally moves her extremities to pain, does not speak at baseline     ED Results / Procedures / Treatments   Labs (all labs ordered are listed, but only abnormal results are displayed) Labs Reviewed  CBC WITH DIFFERENTIAL/PLATELET - Abnormal; Notable for the following components:      Result Value   WBC 13.6 (*)    RBC 5.48 (*)    Hemoglobin 16.6 (*)    HCT 53.0 (*)    Neutro Abs 10.7 (*)    Monocytes Absolute 1.1 (*)    All other components within normal limits  COMPREHENSIVE METABOLIC PANEL - Abnormal; Notable for the following components:   Sodium 155 (*)    Chloride 120 (*)    CO2 21 (*)    Glucose, Bld 110 (*)    BUN 82 (*)    Creatinine, Ser 1.43 (*)    AST 57 (*)    ALT 103 (*)    Total Bilirubin 1.7 (*)    GFR calc non Af Amer 36 (*)    GFR calc Af Amer 41 (*)    All other components within normal limits  D-DIMER, QUANTITATIVE (NOT [facility]) - Abnormal; Notable for the following components:   D-Dimer, Quant 10.15 (*)    All other components within normal limits  LACTATE DEHYDROGENASE - Abnormal; Notable for the following components:   LDH 283 (*)    All other components within normal limits  URINALYSIS, ROUTINE W REFLEX MICROSCOPIC - Abnormal; Notable for the following components:   Color, Urine AMBER (*)    APPearance HAZY (*)    Hgb urine dipstick SMALL (*)     Leukocytes,Ua MODERATE (*)    Bacteria, UA MANY (*)    All other components within normal limits  TRIGLYCERIDES - Abnormal; Notable for the following components:  Triglycerides 156 (*)    All other components within normal limits  CULTURE, BLOOD (ROUTINE X 2)  CULTURE, BLOOD (ROUTINE X 2)  URINE CULTURE  RESPIRATORY PANEL BY RT PCR (FLU A&B, COVID)  LACTIC ACID, PLASMA  FIBRINOGEN  LACTIC ACID, PLASMA  PROCALCITONIN  FERRITIN  C-REACTIVE PROTEIN  POC SARS CORONAVIRUS 2 AG -  ED    EKG EKG Interpretation  Date/Time:  Wednesday February 23 2019 13:51:38 EST Ventricular Rate:  63 PR Interval:    QRS Duration: 97 QT Interval:  421 QTC Calculation: 431 R Axis:   -45 Text Interpretation: Sinus rhythm Left anterior fascicular block Abnormal R-wave progression, early transition Confirmed by Virgina Norfolk [phone]) on 02/23/2019 2:45:16 PM   Radiology CT Head Wo Contrast  Result Date: 02/23/2019 CLINICAL DATA:  Altered mental status.  COVID-19 positive EXAM: CT HEAD WITHOUT CONTRAST TECHNIQUE: Contiguous axial images were obtained from the base of the skull through the vertex without intravenous contrast. COMPARISON:  July 04, 2018 FINDINGS: Brain: Moderate atrophy is stable. There is no intracranial mass, hemorrhage, extra-axial fluid collection, or midline shift. There is patchy small vessel disease in the centra semiovale bilaterally. Small vessel disease is noted in each posterior limb internal capsule region. No acute infarct evident. Vascular: There is no hyperdense vessel. There are foci of carotid siphon arterial calcification. Skull: Bony calvarium appears intact. Sinuses/Orbits: There is opacification in each sphenoid sinus with air-fluid levels in each sphenoid sinus. There is opacification in the posterior left ethmoid region with multiple areas of ethmoid sinus mucosal thickening. Orbits appear symmetric bilaterally. Other: The mastoid air cells are clear. There is debris in  each external auditory canal, more notable on the left than on the right. IMPRESSION: Atrophy with supratentorial small vessel disease. No acute infarct evident. No mass or hemorrhage. There are foci of arterial vascular calcification. There are multiple foci of paranasal sinus disease, most notable in the sphenoid and left ethmoid regions. Probable cerumen in the external auditory canals, more on the left than on the right. Electronically Signed   By: Bretta Bang III M.D.   On: 02/23/2019 14:28   DG Chest Port 1 View  Result Date: 02/23/2019 CLINICAL DATA:  COVID positive EXAM: PORTABLE CHEST 1 VIEW COMPARISON:  11/08/2018 FINDINGS: Heart and mediastinal contours are within normal limits. No focal opacities or effusions. No acute bony abnormality. IMPRESSION: Negative Electronically Signed   By: Charlett Nose M.D.   On: 02/23/2019 14:52    Procedures .Critical Care Performed by: Virgina Norfolk, DO Authorized by: Virgina Norfolk, DO   Critical care provider statement:    Critical care time (minutes):  65   Critical care was necessary to treat or prevent imminent or life-threatening deterioration of the following conditions:  Dehydration, metabolic crisis and CNS failure or compromise   Critical care was time spent personally by me on the following activities:  Blood draw for specimens, development of treatment plan with patient or surrogate, discussions with primary provider, evaluation of patient's response to treatment, examination of patient, obtaining history from patient or surrogate, ordering and performing treatments and interventions, ordering and review of laboratory studies, ordering and review of radiographic studies, pulse oximetry, re-evaluation of patient's condition and review of old charts   I assumed direction of critical care for this patient from another provider in my specialty: no     (including critical care time)  Medications Ordered in ED Medications  sodium  chloride 0.9 % bolus 1,000 mL (has no administration  in time range)  cefTRIAXone (ROCEPHIN) 1 g in sodium chloride 0.9 % 100 mL IVPB (has no administration in time range)  0.9 %  sodium chloride infusion (has no administration in time range)    ED Course  I have reviewed the triage vital signs and the nursing notes.  Pertinent labs & imaging results that were available during my care of the patient were reviewed by me and considered in my medical decision making (see chart for details).    MDM Rules/Calculators/A&P                       Khalise Billard is a 73 year old female with history of hypertension, stroke, severe aphasia, recent Covid infection who presents to the ED with altered mental status from dementia Covid unit at her nursing facility.  Patient with normal vitals.  No fever.  Unable to get in touch with family however MOST form does show that she is full code.  Talked with her nurse at her facility who states that patient has had more lethargy than usual.  She is normally able to track very well with her eyes but that has gotten worse over the last couple days.  She has general weakness throughout that is also possibly gotten worse.  Mostly they are concerned because she has not had anything to eat or drink for the last 2 days.  Patient appears severely dehydrated on exam with very dry mucous membranes.  She opens her eyes spontaneously but fully cannot participate in examination.  She does seem to withdraw extremities slightly to pain.  Vital signs are stable.  Possibly dehydration in the setting of coronavirus.  Possibly electrolyte abnormalities.  Does not have a fever.   CT scan showed no acute findings.  Chest x-ray with no signs of infection.  However, inflammatory markers are elevated likely in the setting of recent coronavirus.  Point-of-care Covid test is negative.  Patient does have multiple electrolyte abnormalities likely from dehydration.  Sodium is 155.  Creatinine is  1.45.  She has mild leukocytosis and appears to be hemoconcentrated with a hemoglobin of 16.6.  Urinalysis concerning for infection as well.  Patient was given Rocephin.  She was given IV fluid bolus and started on maintenance fluids.  Will admit the patient to medicine for further care.  Likely acute on chronic mental status change in the setting of UTI, dehydration, hypernatremia.  This chart was dictated using voice recognition software.  Despite best efforts to proofread,  errors can occur which can change the documentation meaning.    Final Clinical Impression(s) / ED Diagnoses Final diagnoses:  Altered mental status, unspecified altered mental status type  Hypernatremia  AKI (acute kidney injury) ([place])  Urinary tract infection without hematuria, site unspecified    Rx / DC Orders ED Discharge Orders    None       Lennice Sites, DO 02/23/19 1512

## 2019-02-24 DIAGNOSIS — N179 Acute kidney failure, unspecified: Principal | ICD-10-CM

## 2019-02-24 LAB — CBC WITH DIFFERENTIAL/PLATELET
Abs Immature Granulocytes: 0.05 10*3/uL (ref 0.00–0.07)
Basophils Absolute: 0 10*3/uL (ref 0.0–0.1)
Basophils Relative: 0 %
Eosinophils Absolute: 0 10*3/uL (ref 0.0–0.5)
Eosinophils Relative: 0 %
HCT: 46.9 % — ABNORMAL HIGH (ref 36.0–46.0)
Hemoglobin: 14.4 g/dL (ref 12.0–15.0)
Immature Granulocytes: 0 %
Lymphocytes Relative: 16 %
Lymphs Abs: 2 10*3/uL (ref 0.7–4.0)
MCH: 29.8 pg (ref 26.0–34.0)
MCHC: 30.7 g/dL (ref 30.0–36.0)
MCV: 96.9 fL (ref 80.0–100.0)
Monocytes Absolute: 1 10*3/uL (ref 0.1–1.0)
Monocytes Relative: 8 %
Neutro Abs: 9.8 10*3/uL — ABNORMAL HIGH (ref 1.7–7.7)
Neutrophils Relative %: 76 %
Platelets: 229 10*3/uL (ref 150–400)
RBC: 4.84 MIL/uL (ref 3.87–5.11)
RDW: 13.6 % (ref 11.5–15.5)
WBC: 12.9 10*3/uL — ABNORMAL HIGH (ref 4.0–10.5)
nRBC: 0 % (ref 0.0–0.2)

## 2019-02-24 LAB — COMPREHENSIVE METABOLIC PANEL
ALT: 93 U/L — ABNORMAL HIGH (ref 0–44)
AST: 54 U/L — ABNORMAL HIGH (ref 15–41)
Albumin: 3.7 g/dL (ref 3.5–5.0)
Alkaline Phosphatase: 76 U/L (ref 38–126)
Anion gap: 10 (ref 5–15)
BUN: 65 mg/dL — ABNORMAL HIGH (ref 8–23)
CO2: 21 mmol/L — ABNORMAL LOW (ref 22–32)
Calcium: 9.2 mg/dL (ref 8.9–10.3)
Chloride: 120 mmol/L — ABNORMAL HIGH (ref 98–111)
Creatinine, Ser: 1.06 mg/dL — ABNORMAL HIGH (ref 0.44–1.00)
GFR calc Af Amer: 59 mL/min — ABNORMAL LOW (ref >60)
GFR calc non Af Amer: 51 mL/min — ABNORMAL LOW (ref >60)
Glucose, Bld: 132 mg/dL — ABNORMAL HIGH (ref 70–99)
Potassium: 3.8 mmol/L (ref 3.5–5.1)
Sodium: 151 mmol/L — ABNORMAL HIGH (ref 135–145)
Total Bilirubin: 1.2 mg/dL (ref 0.3–1.2)
Total Protein: 6.7 g/dL (ref 6.5–8.1)

## 2019-02-24 LAB — PHOSPHORUS: Phosphorus: 2.3 mg/dL — ABNORMAL LOW (ref 2.5–4.6)

## 2019-02-24 LAB — D-DIMER, QUANTITATIVE: D-Dimer, Quant: 7.29 ug/mL-FEU — ABNORMAL HIGH (ref 0.00–0.50)

## 2019-02-24 LAB — C-REACTIVE PROTEIN: CRP: <0.5 mg/dL (ref <1.0)

## 2019-02-24 LAB — FERRITIN: Ferritin: 476 ng/mL — ABNORMAL HIGH (ref 11–307)

## 2019-02-24 LAB — MAGNESIUM: Magnesium: 2.9 mg/dL — ABNORMAL HIGH (ref 1.7–2.4)

## 2019-02-24 MED ORDER — OSELTAMIVIR PHOSPHATE 75 MG PO CAPS: 75.0000 mg | ORAL_CAPSULE | Freq: Every day | ORAL | Status: DC

## 2019-02-24 NOTE — Progress Notes (Signed)
Hospitalist daily note   Leah Jacobs [id number] DOB: 18-Sep-1945 DOA: 02/23/2019  PCP: [facility]   Narrative:  44 skilled nursing facility WF HTN hypothyroid primary progressive aphasia seen by [facility] neurology PA San[place] depression with suicidal ideation in the past and CVA with residual deficits She is aphasic at baseline normally ambulatory Tested positive for coronavirus 02/11/2019 and has become less responsive and was sent over to the ED-MOST form shows she is a full code CT scan no acute findings CXR no A. fib Sodium 155 currently 1.4 UA suggestive of infection Data Reviewed:  Sodium 155--151 BUN/creatinine 82/1.4-->65/1.0 AST/ALT 57/93 Ferritin 447-->476 CRP less than 0.5-->less than 0.5 Lactic acid 1.6-->1.5 Procalcitonin 0.10 LDH 283 WBC 12.9 Assessment & Plan: Probable urinary tract infection Continue treatment with Rocephin 1 g daily until cultures come back feel that patient's presentation is probably secondary to this Coronavirus + 02/11/2019 Not symptomatic not on oxygen at this time monitor Will cycled inflammatory markers over the next 24 hours and as is almost 14 days outside of index infection time-would still maintain isolation and will retest again in a.m. with PCR although point-of-care test was negative Severe hypernatremia AKI-prior to admission on lisinopril Somewhat improved continue D5 100 cc/H Primary progressive aphasia followed at [facility] Patient unable to really follow commands and not able to verbalize well-this is worse than baseline apparently patient does still ambulate Mild transaminitis Trend in a.m.  Lovenox, full code, inpatient pending resolution Discussed with husband [phone] poc and he understands  Subjective: Awake alert no distress no focal deficit but unable to really orient and does not discuss she does not talk with me Consultants:   No Procedures:   No Antimicrobials:   Ceftriaxone   Objective: Vitals:    02/24/19 0200 02/24/19 0245 02/24/19 0345 02/24/19 0445  BP: 111/64 111/62 104/62 104/61  Pulse: 72 61 62 62  Resp: (!) 21 19 19 19   Temp:      TempSrc:      SpO2: 97% 95% 96% 94%    Intake/Output Summary (Last 24 hours) at 02/24/2019 0647 Last data filed at 02/23/2019 1540 Gross per 24 hour  Intake 1000 ml  Output --  Net 1000 ml   There were no vitals filed for this visit.  Examination: Awake alert cannot assess coherency or mental status as she does not respond She seems to have some spasm + restriction motion of upper extremities Chest clinically clear no added sound no rales no rhonchi Abdomen soft nontender nondistended no rebound Neurologically intact moving all 4 limbs equally  Scheduled Meds: . enoxaparin (LOVENOX) injection  30 mg Subcutaneous Q24H  . sodium chloride flush  3 mL Intravenous Q12H   Continuous Infusions: . cefTRIAXone (ROCEPHIN)  IV    . dextrose 100 mL/hr at 02/24/19 0258  . famotidine (PEPCID) IV Stopped (02/23/19 1945)     LOS: 1 day   Time spent: [place], MD [facility]

## 2019-02-24 NOTE — ED Notes (Signed)
Pt relaxing comfortably in bed. Environment secure and vitals WDL

## 2019-02-24 NOTE — ED Notes (Addendum)
Pt's husband came in wanting an update on his wife whenever possible.  Leah Jacobs [phone]

## 2019-02-24 NOTE — ED Notes (Signed)
RN talked to husband on the phone and updated him.

## 2019-02-25 ENCOUNTER — Encounter (HOSPITAL_COMMUNITY): Payer: Medicare Other

## 2019-02-25 DIAGNOSIS — L8990 Pressure ulcer of unspecified site, unspecified stage: Secondary | ICD-10-CM | POA: Insufficient documentation

## 2019-02-25 LAB — URINE CULTURE: Culture: >=100000 — AB

## 2019-02-25 LAB — COMPREHENSIVE METABOLIC PANEL
ALT: 89 U/L — ABNORMAL HIGH (ref 0–44)
AST: 56 U/L — ABNORMAL HIGH (ref 15–41)
Albumin: 3.5 g/dL (ref 3.5–5.0)
Alkaline Phosphatase: 74 U/L (ref 38–126)
Anion gap: 11 (ref 5–15)
BUN: 30 mg/dL — ABNORMAL HIGH (ref 8–23)
CO2: 22 mmol/L (ref 22–32)
Calcium: 9.2 mg/dL (ref 8.9–10.3)
Chloride: 119 mmol/L — ABNORMAL HIGH (ref 98–111)
Creatinine, Ser: 0.76 mg/dL (ref 0.44–1.00)
GFR calc Af Amer: >60 mL/min (ref >60)
GFR calc non Af Amer: >60 mL/min (ref >60)
Glucose, Bld: 115 mg/dL — ABNORMAL HIGH (ref 70–99)
Potassium: 3.4 mmol/L — ABNORMAL LOW (ref 3.5–5.1)
Sodium: 152 mmol/L — ABNORMAL HIGH (ref 135–145)
Total Bilirubin: 1.1 mg/dL (ref 0.3–1.2)
Total Protein: 6.3 g/dL — ABNORMAL LOW (ref 6.5–8.1)

## 2019-02-25 LAB — D-DIMER, QUANTITATIVE: D-Dimer, Quant: 11.55 ug/mL-FEU — ABNORMAL HIGH (ref 0.00–0.50)

## 2019-02-25 LAB — CBC WITH DIFFERENTIAL/PLATELET
Abs Immature Granulocytes: 0.03 10*3/uL (ref 0.00–0.07)
Basophils Absolute: 0 10*3/uL (ref 0.0–0.1)
Basophils Relative: 0 %
Eosinophils Absolute: 0.1 10*3/uL (ref 0.0–0.5)
Eosinophils Relative: 1 %
HCT: 43.9 % (ref 36.0–46.0)
Hemoglobin: 13.5 g/dL (ref 12.0–15.0)
Immature Granulocytes: 0 %
Lymphocytes Relative: 23 %
Lymphs Abs: 2.1 10*3/uL (ref 0.7–4.0)
MCH: 30.1 pg (ref 26.0–34.0)
MCHC: 30.8 g/dL (ref 30.0–36.0)
MCV: 98 fL (ref 80.0–100.0)
Monocytes Absolute: 0.7 10*3/uL (ref 0.1–1.0)
Monocytes Relative: 8 %
Neutro Abs: 6.1 10*3/uL (ref 1.7–7.7)
Neutrophils Relative %: 68 %
Platelets: 190 10*3/uL (ref 150–400)
RBC: 4.48 MIL/uL (ref 3.87–5.11)
RDW: 13.7 % (ref 11.5–15.5)
WBC: 9.1 10*3/uL (ref 4.0–10.5)
nRBC: 0 % (ref 0.0–0.2)

## 2019-02-25 LAB — MAGNESIUM: Magnesium: 2.5 mg/dL — ABNORMAL HIGH (ref 1.7–2.4)

## 2019-02-25 LAB — C-REACTIVE PROTEIN: CRP: 0.5 mg/dL (ref <1.0)

## 2019-02-25 LAB — FERRITIN: Ferritin: 545 ng/mL — ABNORMAL HIGH (ref 11–307)

## 2019-02-25 LAB — PHOSPHORUS: Phosphorus: 1.9 mg/dL — ABNORMAL LOW (ref 2.5–4.6)

## 2019-02-25 MED ORDER — APIXABAN 5 MG PO TABS
10.0000 mg | ORAL_TABLET | Freq: Two times a day (BID) | ORAL | Status: DC
  Administered 2019-02-25 – 2019-02-26 (×2): 10 mg via ORAL
  Filled 2019-02-25 (×2): qty 2

## 2019-02-25 MED ORDER — APIXABAN 5 MG PO TABS: 5.0000 mg | ORAL_TABLET | Freq: Two times a day (BID) | ORAL | Status: DC

## 2019-02-25 MED ORDER — CEPHALEXIN 500 MG PO CAPS
500.0000 mg | ORAL_CAPSULE | Freq: Three times a day (TID) | ORAL | Status: DC
  Administered 2019-02-25: 500 mg via ORAL
  Filled 2019-02-25: qty 1

## 2019-02-25 MED ORDER — OSMOLITE 1.2 CAL PO LIQD
1000.0000 mL | ORAL | Status: DC
  Administered 2019-02-25: 1000 mL
  Filled 2019-02-25 (×2): qty 1000

## 2019-02-25 MED ORDER — CEPHALEXIN 250 MG/5ML PO SUSR
500.0000 mg | Freq: Three times a day (TID) | ORAL | Status: DC
  Administered 2019-02-25 – 2019-02-26 (×2): 500 mg via ORAL
  Filled 2019-02-25 (×5): qty 10

## 2019-02-25 NOTE — Progress Notes (Addendum)
Spouse, Antonio Junker and son, Maleea Camilo dropped off patient healthcare Power of attorney, living will, and general power of attorney. Copys made and placed in paper chart. [place], on call hospitalist, paged and spoke with regarding possible change of patient goals during this hospitalization.

## 2019-02-25 NOTE — Evaluation (Signed)
Clinical/Bedside Swallow Evaluation Patient Details  Name: Leah Jacobs MRN: [id number] Date of Birth: 01-27-1946  Today's Date: 02/25/2019 Time: SLP Start Time (ACUTE ONLY): 1130 SLP Stop Time (ACUTE ONLY): 1155 SLP Time Calculation (min) (ACUTE ONLY): 25 min  Past Medical History:  Past Medical History:  Diagnosis Date  . Hypertension   . Stroke (HCC)   . Thyroid disease    Past Surgical History:  Past Surgical History:  Procedure Laterality Date  . TONSILLECTOMY     HPI:  68 skilled nursing facility WF HTN hypothyroid primary progressive aphasia seen by [facility] neurology PA San[place]ilipp[place] depression with suicidal ideation in the past and CVA with residual deficits. She is aphasic at baseline normally ambulatory. Tested positive for coronavirus 02/11/2019 and has become less responsive and was sent over to the ED-MOST form shows she is a full code. CT scan no acute findings CXR no A. fib. No hx of dysphagia found in chart.    Assessment / Plan / Recommendation Clinical Impression  Pt found to ahve severely dry encruseted oral cavity, teeth, lips and mucosa. Prolonged oral care provided. Pt at times with congested weak coughing and bite reflex, but minimal labial seal. A few spontaneous swallows noted, followed by coughing. Once mouth mush improved attempted ice chip trials with minimal response. Bried mastication of ice stimulated, but otherwise pt did not respond to PO. Mentation is a barrier to intake at this time. Underlying dysphagia may also be present. Pt will need further diagnostic treatment. Q4 oral care is absolutely necessary.  SLP Visit Diagnosis: Dysphagia, oropharyngeal phase (R13.12)    Aspiration Risk  Severe aspiration risk;Risk for inadequate nutrition/hydration    Diet Recommendation NPO;Alternative means - temporary        Other  Recommendations Oral Care Recommendations: Oral care QID Other Recommendations: Have oral suction available   Follow up Recommendations  Skilled Nursing facility      Frequency and Duration min 2x/week  2 weeks       Prognosis Prognosis for Safe Diet Advancement: Good Barriers to Reach Goals: Language deficits      Swallow Study   General HPI: 68 skilled nursing facility WF HTN hypothyroid primary progressive aphasia seen by [facility] neurology PA San[place]ilipp[place] depression with suicidal ideation in the past and CVA with residual deficits. She is aphasic at baseline normally ambulatory. Tested positive for coronavirus 02/11/2019 and has become less responsive and was sent over to the ED-MOST form shows she is a full code. CT scan no acute findings CXR no A. fib. No hx of dysphagia found in chart.  Type of Study: Bedside Swallow Evaluation Previous Swallow Assessment: none Diet Prior to this Study: NPO Temperature Spikes Noted: No Respiratory Status: Room air History of Recent Intubation: No Behavior/Cognition: Lethargic/Drowsy;Requires cueing;Doesn't follow directions Oral Cavity Assessment: Dried secretions Oral Care Completed by SLP: Yes Oral Cavity - Dentition: Adequate natural dentition Self-Feeding Abilities: Total assist Patient Positioning: Upright in bed Baseline Vocal Quality: Not observed Volitional Cough: Congested    Oral/Motor/Sensory Function Overall Oral Motor/Sensory Function: Generalized oral weakness   Ice Chips Ice chips: Impaired Presentation: Spoon Oral Phase Impairments: Poor awareness of bolus;Impaired mastication;Reduced lingual movement/coordination;Reduced labial seal   Thin Liquid Thin Liquid: Not tested    Nectar Thick Nectar Thick Liquid: Not tested   Honey Thick Honey Thick Liquid: Not tested   Puree Puree: Not tested   Solid     Solid: Not tested     Harlon Ditty, MA CCC-SLP  Acute Rehabilitation Services Pager  [phone] Office [phone]  Leah Jacobs 02/25/2019,12:03 PM

## 2019-02-25 NOTE — Procedures (Signed)
Cortrak  Person Inserting Tube:  Taren Dymek M, RD Tube Type:  Cortrak - 43 inches Tube Location:  Right nare Initial Placement:  Stomach Secured by: Bridle Technique Used to Measure Tube Placement:  Documented cm marking at nare/ corner of mouth Cortrak Secured At:  65 cm Procedure Comments:  Cortrak Tube Team Note:  Consult received to place a Cortrak feeding tube.   No x-ray is required. RN may begin using tube.     If the tube becomes dislodged please keep the tube and contact the Cortrak team at [email or web address] ([email or web address]) for replacement.  If after hours and replacement cannot be delayed, place a NG tube and confirm placement with an abdominal x-ray.     Marvin Maenza, MS, RD, LDN, CNSC Inpatient Clinical Dietitian Pager # [phone] After hours/weekend pager # [phone]      

## 2019-02-25 NOTE — Progress Notes (Addendum)
ANTICOAGULATION CONSULT NOTE - Initial Consult  Pharmacy Consult for Apixaban Indication: Treatment of DVT  Allergies  Allergen Reactions  . Sulfa Antibiotics Hives  . Gadolinium Derivatives     Unknown; pt does not remember reaction  . Neosporin [Neomycin-Bacitracin Zn-Polymyx] Rash    Patient Measurements: Weight: 126 lb 15.8 oz (57.6 kg)   Vital Signs: Temp: 98.5 F (36.9 C) (12/18 0800) Temp Source: Oral (12/18 0800) BP: 109/61 (12/18 0800) Pulse Rate: 53 (12/18 0800)  Labs: Recent Labs    02/23/19 1328 02/24/19 0410 02/25/19 0724  HGB 16.6* 14.4 13.5  HCT 53.0* 46.9* 43.9  PLT 298 229 190  CREATININE 1.43* 1.06* 0.76    Estimated Creatinine Clearance: 48.1 mL/min (by C-G formula based on SCr of 0.76 mg/dL).   Medical History: Past Medical History:  Diagnosis Date  . Hypertension   . Stroke ([place])   . Thyroid disease     Assessment: 73 yo female with recent COVID infection now with DDimer of 11.5. Patient to get LE dopplers and start on apixaban treatment empirically for DVT.   Goal of Therapy:  Monitor platelets by anticoagulation protocol: Yes   Plan:  Apixaban 10mg  PO BID x 7 days then 5mg  BID thereafter D/C lovenox F/u LE dopplers Monitor for bleeding  Mardi Cannady A. Levada Dy, PharmD, BCPS, FNKF Clinical Pharmacist [place] Please utilize Amion for appropriate phone number to reach the unit pharmacist ([place])   02/25/2019,10:29 AM

## 2019-02-25 NOTE — TOC Initial Note (Signed)
Transition of Care [facility]) - Initial/Assessment Note    Patient Details  Name: Leah Jacobs MRN: [id number] Date of Birth: [birth year]  Transition of [facility]) CM/SW Contact:    Mearl Latin, LCSW Phone Number: 02/25/2019, 5:09 PM  Clinical Narrative:                 Patient resides at [facility]. Per liaison at [facility], patient able to return once stable. CSW continuing to follow for discharge needs.    Barriers to Discharge: Continued Medical Work up   Patient Goals and CMS Choice        Expected Discharge Plan and Services   In-house Referral: Clinical Social Work     Living arrangements for the past 2 months: Skilled Nursing Facility                                      Prior Living Arrangements/Services Living arrangements for the past 2 months: Skilled Nursing Facility Lives with:: Facility Resident Patient language and need for interpreter reviewed:: Yes Do you feel safe going back to the place where you live?: Yes      Need for Family Participation in Patient Care: Yes (Comment) Care giver support system in place?: Yes (comment)   Criminal Activity/Legal Involvement Pertinent to Current Situation/Hospitalization: No - Comment as needed  Activities of Daily Living Home Assistive Devices/Equipment: None ADL Screening (condition at time of admission) Patient's cognitive ability adequate to safely complete daily activities?: No Is the patient deaf or have difficulty hearing?: No Does the patient have difficulty seeing, even when wearing glasses/contacts?: No Does the patient have difficulty concentrating, remembering, or making decisions?: No Patient able to express need for assistance with ADLs?: No Does the patient have difficulty dressing or bathing?: Yes Independently performs ADLs?: No Communication: Needs assistance, Dependent Is this a change from baseline?: Pre-admission baseline Dressing (OT): Needs assistance, Dependent Is  this a change from baseline?: Pre-admission baseline Grooming: Needs assistance, Dependent Is this a change from baseline?: Pre-admission baseline Feeding: Needs assistance Is this a change from baseline?: Pre-admission baseline Bathing: Needs assistance Is this a change from baseline?: Pre-admission baseline Toileting: Needs assistance Is this a change from baseline?: Pre-admission baseline In/Out Bed: Needs assistance Is this a change from baseline?: Pre-admission baseline Walks in Home: Needs assistance Is this a change from baseline?: Change from baseline, expected to last >3 days Does the patient have difficulty walking or climbing stairs?: Yes Weakness of Legs: Both Weakness of Arms/Hands: Both  Permission Sought/Granted Permission sought to share information with : Facility Medical sales representative, Family Supports Permission granted to share information with : No  Share Information with NAME: Antonio  Permission granted to share info w AGENCY: [facility]  Permission granted to share info w Relationship: Spouse  Permission granted to share info w Contact Information: [phone]  Emotional Assessment Appearance:: Appears stated age Attitude/Demeanor/Rapport: Unable to Assess Affect (typically observed): Unable to Assess Orientation: : (Disoriented) Alcohol / Substance Use: Not Applicable Psych Involvement: No (comment)  Admission diagnosis:  Hypernatremia [E87.0] AKI (acute kidney injury) (HCC) [N17.9] Urinary tract infection without hematuria, site unspecified [N39.0] Altered mental status, unspecified altered mental status type [R41.82] Patient Active Problem List   Diagnosis Date Noted  . Pressure injury of skin 02/25/2019  . AKI (acute kidney injury) (HCC) 02/23/2019  . COVID-19 virus infection 02/23/2019  . Hypernatremia 02/23/2019  . Leukocytosis 02/23/2019  .  Dementia ([place]) 02/23/2019  . History of CVA with residual deficit 02/23/2019  . Acute lower UTI  02/23/2019  . Hypertension 04/21/2018  . Scalp hematoma, initial encounter 04/20/2018  . Dizziness 11/24/2017  . Primary progressive aphasia ([place]) 09/23/2017  . Disequilibrium 09/23/2017  . Sensorineural hearing loss (SNHL) of both ears 09/23/2017  . Bradycardia 04/13/2017  . OSA (obstructive sleep apnea) 03/11/2017  . Mixed hyperlipidemia 05/16/2014  . Acquired hypothyroidism 02/26/2012   PCP:  [facility] Pharmacy:   [id number] [place], [place] [address] [place] [address] Phone: [phone] Fax: [phone]     Social Determinants of Health (SDOH) Interventions    Readmission Risk Interventions No flowsheet data found.

## 2019-02-25 NOTE — Progress Notes (Signed)
Initial Nutrition Assessment  RD working remotely.  DOCUMENTATION CODES:   Not applicable  INTERVENTION:   Once Cortrak placed, initiate tube feeds: - Start Osmolite 1.2 @ 25 ml/hr and titrate by 10 ml q 4 hours until goal rate of 55 ml/hr (1320 ml/day) - Free water per MD  Tube feeding regimen at goal provides 1584 kcal, 73 grams of protein, and 1082 ml of H2O.   Monitor magnesium, potassium, and phosphorus daily for at least 3 days, MD to replete as needed, as pt is at risk for refeeding syndrome given hypophosphatemia, hypokalemia, poor PO intake PTA.  NUTRITION DIAGNOSIS:   Inadequate oral intake related to lethargy/confusion as evidenced by NPO status.  GOAL:   Patient will meet greater than or equal to 90% of their needs  MONITOR:   Diet advancement, Labs, Weight trends, TF tolerance, Skin  REASON FOR ASSESSMENT:   Malnutrition Screening Tool, Consult Enteral/tube feeding initiation and management  ASSESSMENT:   73 year old female who presented to the ED on 12/16 with AMS and COVID-19 positive. PMH of HTN, stroke, severe aphasia. Pt admitted with AKI secondary to dehydration.   Per notes on 12/16, pt had not eaten in 2 days.  Pt failed bedside swallow evaluation today. Pt to remain NPO. Cortrak team has been consulted for placement of Cortrak feeding tube. RD consulted for TF initiation and management.  Discussed hypophosphatemia and hypokalemia with MD. Pt likely refeeding on D5.  Reviewed weight history in chart. Pt with a 10.4 kg weight loss since 04/20/18. This is a 15.2% weight loss in 10 months which is not quite significant for timeframe but is concerning. Pt is at risk for malnutrition.  Discussed plan with RN.  Medications reviewed and include: IV Pepcid IVF: D5 @ 100 ml/hr  Labs reviewed: sodium 152, potassium 3.4, chloride 119, phosphorus 1.9, magnesium 2.5, elevated LFTs CBG's: 118  NUTRITION - FOCUSED PHYSICAL EXAM:  Unable to complete at  this time. RD working remotely.  Diet Order:   Diet Order            Diet NPO time specified  Diet effective now              EDUCATION NEEDS:   No education needs have been identified at this time  Skin:  Skin Assessment: Skin Integrity Issues: Stage II: sacrum  Last BM:  no documented BM  Height:   Ht Readings from Last 1 Encounters:  11/08/18 5\' 2"  (1.575 m)    Weight:   Wt Readings from Last 1 Encounters:  02/25/19 57.6 kg    Ideal Body Weight:  50 kg  BMI:  Body mass index is 23.23 kg/m.  Estimated Nutritional Needs:   Kcal:  1450-1650  Protein:  70-85 grams  Fluid:  >/= 1.5 L    Gaynell Face, MS, RD, LDN Inpatient Clinical Dietitian Pager: [phone] Weekend/After Hours: [phone]

## 2019-02-25 NOTE — Progress Notes (Addendum)
Hospitalist daily note   Leah Jacobs [id number] DOB: 06/02/1945 DOA: 02/23/2019  [facility]P: [facility]   Narrative:  51 skilled nursing facility WF HTN hypothyroid primary progressive aphasia seen by [facility] neurology PA Sanfilippo depression with suicidal ideation in the past and CVA with residual deficits She is aphasic at baseline normally ambulatory Tested positive for coronavirus 02/11/2019 and has become less responsive and was sent over to the ED-MOST form shows she is a full code CT scan no acute findings CXR no A. fib Sodium 155 currently 1.4 UA suggestive of infection Data Reviewed:  Sodium 155--151 BUN/creatinine 82/1.4-->65/1.0 AST/ALT 57/93 Ferritin 447-->476--547 CRP less than 0.5-->less than 0.5--0.5 Dimer 11.5 Lactic acid 1.6-->1.5 Procalcitonin 0.10 LDH 283  WBC 12.9-->9.1 Assessment & Plan:  Probable urinary tract infection klebsiella/e.coli Changed rocephin to Keflex--complete on 12/20 [5 d] Coronavirus + 02/11/2019 Not symptomatic not on oxygen at this time monitor Will cycled inflammatory markers over the next 24 hours and as is almost 14 days outside of index infection time-would still maintain isolation and will retest again in a.m. with PCR although point-of-care test was negative Dimer 11.5  get LE doppler At risk for DVT--start eliquis--would continue and Rx for 15 days post hospital stay Severe hypernatremia AKI-prior to admission on lisinopril Somewhat improved continue D5 100 cc/H Recheck in am Primary progressive aphasia followed at [facility] Patient unable to really follow commands and not able to verbalize well-this is worse than baseline apparently patient does still ambulate Mild transaminitis Trend in a.m.--significance unclear likely 2/2 Covid  Lovenox, full code, inpatient pending resolution Discussed with husband [phone] plan--we discussed the feeding tube and he states her living will states "no forced feeding"--he is ok for temporary  feeds He will bring a copy of the living will to help out  Subjective:  Cannot give ROS Sleepy sats 97% on RA  Consultants:   No Procedures:   No Antimicrobials:   Ceftriaxone   Objective: Vitals:   02/24/19 2300 02/24/19 2315 02/25/19 0130 02/25/19 0501  BP: 102/61 107/63 (!) 109/55 (!) 108/57  Pulse: (!) 55 (!) 55 (!) 50 (!) 52  Resp: 18 18 16 17   Temp:   98.6 F (37 C) 98.2 F (36.8 C)  TempSrc:   Axillary Axillary  SpO2: 96% 97% 95% 97%  Weight:   57.6 kg    No intake or output data in the 24 hours ending 02/25/19 0827 Filed Weights   02/25/19 0130  Weight: 57.6 kg    Examination: Awake alert no response to talk-mouth dry restriction motion of upper extremities Chest clinically clear no added sound no rales no rhonchi Abdomen soft nontender nondistended no rebound Neurologically intact moving all 4 limbs equally  Scheduled Meds: . enoxaparin (LOVENOX) injection  30 mg Subcutaneous Q24H  . sodium chloride flush  3 mL Intravenous Q12H   Continuous Infusions: . cefTRIAXone (ROCEPHIN)  IV Stopped (02/24/19 1905)  . dextrose 100 mL/hr at 02/25/19 0216  . famotidine (PEPCID) IV Stopped (02/24/19 1813)     LOS: 2 days   Time spent: [place], MD [facility]

## 2019-02-26 ENCOUNTER — Inpatient Hospital Stay (HOSPITAL_COMMUNITY): Payer: Medicare Other

## 2019-02-26 DIAGNOSIS — I82409 Acute embolism and thrombosis of unspecified deep veins of unspecified lower extremity: Secondary | ICD-10-CM

## 2019-02-26 LAB — COMPREHENSIVE METABOLIC PANEL
ALT: 114 U/L — ABNORMAL HIGH (ref 0–44)
AST: 82 U/L — ABNORMAL HIGH (ref 15–41)
Albumin: 3.3 g/dL — ABNORMAL LOW (ref 3.5–5.0)
Alkaline Phosphatase: 79 U/L (ref 38–126)
Anion gap: 9 (ref 5–15)
BUN: 18 mg/dL (ref 8–23)
CO2: 22 mmol/L (ref 22–32)
Calcium: 9.1 mg/dL (ref 8.9–10.3)
Chloride: 114 mmol/L — ABNORMAL HIGH (ref 98–111)
Creatinine, Ser: 0.68 mg/dL (ref 0.44–1.00)
GFR calc Af Amer: >60 mL/min (ref >60)
GFR calc non Af Amer: >60 mL/min (ref >60)
Glucose, Bld: 136 mg/dL — ABNORMAL HIGH (ref 70–99)
Potassium: 3.5 mmol/L (ref 3.5–5.1)
Sodium: 145 mmol/L (ref 135–145)
Total Bilirubin: 0.9 mg/dL (ref 0.3–1.2)
Total Protein: 6 g/dL — ABNORMAL LOW (ref 6.5–8.1)

## 2019-02-26 LAB — CBC WITH DIFFERENTIAL/PLATELET
Abs Immature Granulocytes: 0.06 10*3/uL (ref 0.00–0.07)
Basophils Absolute: 0 10*3/uL (ref 0.0–0.1)
Basophils Relative: 0 %
Eosinophils Absolute: 0.2 10*3/uL (ref 0.0–0.5)
Eosinophils Relative: 2 %
HCT: 42.7 % (ref 36.0–46.0)
Hemoglobin: 13.3 g/dL (ref 12.0–15.0)
Immature Granulocytes: 1 %
Lymphocytes Relative: 20 %
Lymphs Abs: 2.1 10*3/uL (ref 0.7–4.0)
MCH: 30.1 pg (ref 26.0–34.0)
MCHC: 31.1 g/dL (ref 30.0–36.0)
MCV: 96.6 fL (ref 80.0–100.0)
Monocytes Absolute: 0.7 10*3/uL (ref 0.1–1.0)
Monocytes Relative: 7 %
Neutro Abs: 7.2 10*3/uL (ref 1.7–7.7)
Neutrophils Relative %: 70 %
Platelets: 176 10*3/uL (ref 150–400)
RBC: 4.42 MIL/uL (ref 3.87–5.11)
RDW: 13.4 % (ref 11.5–15.5)
WBC: 10.3 10*3/uL (ref 4.0–10.5)
nRBC: 0 % (ref 0.0–0.2)

## 2019-02-26 LAB — SARS CORONAVIRUS 2 (TAT 6-24 HRS): SARS Coronavirus 2: POSITIVE — AB

## 2019-02-26 LAB — D-DIMER, QUANTITATIVE: D-Dimer, Quant: 9.09 ug/mL-FEU — ABNORMAL HIGH (ref 0.00–0.50)

## 2019-02-26 LAB — GLUCOSE, CAPILLARY: Glucose-Capillary: 140 mg/dL — ABNORMAL HIGH (ref 70–99)

## 2019-02-26 LAB — C-REACTIVE PROTEIN: CRP: 0.6 mg/dL (ref <1.0)

## 2019-02-26 LAB — MAGNESIUM: Magnesium: 2.1 mg/dL (ref 1.7–2.4)

## 2019-02-26 LAB — FERRITIN: Ferritin: 417 ng/mL — ABNORMAL HIGH (ref 11–307)

## 2019-02-26 LAB — PHOSPHORUS: Phosphorus: 1.8 mg/dL — ABNORMAL LOW (ref 2.5–4.6)

## 2019-02-26 MED ORDER — ORAL CARE MOUTH RINSE
15.0000 mL | Freq: Two times a day (BID) | OROMUCOSAL | Status: DC
  Administered 2019-02-26 (×2): 15 mL via OROMUCOSAL

## 2019-02-26 MED ORDER — CHLORHEXIDINE GLUCONATE 0.12 % MT SOLN
15.0000 mL | Freq: Two times a day (BID) | OROMUCOSAL | Status: DC
  Administered 2019-02-26 – 2019-02-27 (×4): 15 mL via OROMUCOSAL
  Filled 2019-02-26 (×4): qty 15

## 2019-02-26 NOTE — Progress Notes (Signed)
Manufacturing engineer [facility]) RN note  Notified by CSW of family request for [facility] services at the facility after discharge. Hospice eligibility pending at this time.   Referred to [facility] via [place] (April) for pending services.   [facility] will be notified of the above referral. Please notify [facility] when pt is ready to leave the unit at discharge [phone] main [place] number.  Please call with hospice related questions.  Thank you for this referral.  Vicente Serene BSN [facility] Liaison [phone]  [place] are listed on AMION

## 2019-02-26 NOTE — TOC Progression Note (Signed)
Transition of Care [facility]) - Progression Note    Patient Details  Name: Leah Jacobs MRN: [id number] Date of Birth: [birth year]  [facility]) CM/SW [place], [place] Phone Number: [phone] 02/26/2019, 1:02 PM  Clinical Narrative:     CSW spoke with Olivia Mackie at [facility] who reports they would be able to accept patient back tomorrow with hospice, referral made to [facility]. RNCM following up with patient husband to inform of dc plan. MD made aware.     Barriers to Discharge: Continued Medical Work up  Expected Discharge Plan and Services   In-house Referral: Clinical Social Work     Living arrangements for the past 2 months: [place]                                       Social Determinants of Health (SDOH) Interventions    Readmission Risk Interventions No flowsheet data found.

## 2019-02-26 NOTE — Care Management (Addendum)
Spoke w patient's spouse, Vivien Rota, to update on plan for discharge tomorrow back to [facility] with [facility]. Vivien Rota expressed appreciation of care patient has received at hospital and he is at peace with plan for hospice at [facility]. He understands that Karsten will discharge back tomorrow via [facility].

## 2019-02-26 NOTE — Progress Notes (Signed)
VASCULAR LAB PRELIMINARY  PRELIMINARY  PRELIMINARY  PRELIMINARY  Bilateral lower extremity venous duplex completed.    Preliminary report:  See CV proc for preliminary results.  Gave Dr. Verlon Au results.   Dyamon Sosinski, RVT 02/26/2019, 12:11 PM

## 2019-02-26 NOTE — Progress Notes (Signed)
Patient arrived to [id number]. Patient disoriented x4, patient will look at you when her name is called. Pupils equal and reactive, brisk, 41mm. Patients mouth is dry, coated tongue with crust. Stage 2 pressure ulcer to sacrum. Placed purewick. HR 50, bp 109/55. paitent on room air. Bed alarm on, Will continue to monitor.

## 2019-02-26 NOTE — Discharge Summary (Signed)
Long discussion with patient's husband and son Cornelia Copa and it appears that they would want the patient to be comfortable-I explained that this may mean different things-they clarified that they realize she has a progressive illness and that she would not want to suffer I discussed IV fluids, anticoagulation, artificial feeding and they tell me they would not want any of this if it has to involve anything invasive such as feeding tubes and/or injections as she is n.p.o. I will discontinue her Eliquis, discontinue core track feeds and get in touch with social work-we will see if we can discharge her with hospice to her facility I have made her DNR after discussions  > 20 minutes care coordination time  Verneita Griffes, MD [facility] 12:31 PM

## 2019-02-26 NOTE — Progress Notes (Addendum)
Hospitalist daily note   Leah Jacobs [id number] DOB: 11-08-1945 DOA: 02/23/2019  PCP: [facility]   Narrative:  17 skilled nursing facility [facility] HTN hypothyroid primary progressive aphasia seen by [facility] neurology PA [place] depression with suicidal ideation in the past and CVA with residual deficits She is aphasic at baseline normally ambulatory Tested positive for coronavirus 02/11/2019 and has become less responsive and was sent over to the ED-MOST form shows she is a full code CT scan no acute findings CXR no A. fib Sodium 155 currently 1.4 UA suggestive of infection Data Reviewed:  Sodium 155--151 BUN/creatinine 82/1.4-->65/1.0 AST/ALT 57/93 Ferritin 447-->476--547--417 CRP less than 0.5-->less than 0.5--0.5--0.6 Dimer 11.5-->9.09 Lactic acid 1.6-->1.5 Procalcitonin 0.10 LDH 283  WBC 12.9-->9.1-->10.3 Assessment & Plan:  Probable urinary tract infection klebsiella/e.coli Changed rocephin to Keflex--complete on 12/20 [5 d] Coronavirus + 02/11/2019 Not symptomatic not on oxygen at this time monitor Covid is still + No O2 req and sattinmg ok Dimer 11.5  get LE doppler--still pending At risk for DVT--start eliquis--would continue and Rx for 15 days post hospital stay Severe hypernatremia AKI-prior to admission on lisinopril Somewhat improved  Improving--cut back rate d5 to 50 cc/h Primary progressive aphasia followed at [facility] Patient unable to really follow commands and not able to verbalize well-this is worse than baseline apparently patient does still ambulate She is not eating-CORTRAK was placed 12/18 as SLP felt she was not a goood candidate nor a safe candidate for PO Mild transaminitis Trend in a.m.--significance unclear likely 2/2 Covid  Lovenox, full code, inpatient pending resolution Discussed with husband [phone] plan--we discussed the feeding tube--she is FULL CODE with all interventions needed  Subjective:  More arousable but non verbal Cortrak  in place Cannot give ROS  Consultants:   No Procedures:   No Antimicrobials:   Ceftriaxone-->keflex complete on 12/20  Objective: Vitals:   02/25/19 1500 02/25/19 1600 02/25/19 2114 02/26/19 0414  BP: (!) 120/55 120/60 126/68 124/60  Pulse: (!) 56 (!) 55 (!) 57 (!) 55  Resp: (!) 30 19 18 16   Temp: 98.5 F (36.9 C)  98.6 F (37 C) 98.8 F (37.1 C)  TempSrc: Axillary  Oral Oral  SpO2: 96% 97% 98% 100%  Weight:        Intake/Output Summary (Last 24 hours) at 02/26/2019 3295 Last data filed at 02/26/2019 1884 Gross per 24 hour  Intake 2890.96 ml  Output 500 ml  Net 2390.96 ml   Filed Weights   02/25/19 0130  Weight: 57.6 kg    Examination: Awake alert non verbal restriction motion of upper extremities Chest clinically clear no added sound no rales no rhonchi Abdomen soft nontender hisute legs no edema Neurologically intact moving all 4 limbs equally  Scheduled Meds: . apixaban  10 mg Oral BID   Followed by  . [START ON 03/04/2019] apixaban  5 mg Oral BID  . cephALEXin  500 mg Oral Q8H  . chlorhexidine  15 mL Mouth Rinse BID  . mouth rinse  15 mL Mouth Rinse q12n4p  . sodium chloride flush  3 mL Intravenous Q12H   Continuous Infusions: . dextrose 100 mL/hr at 02/26/19 0243  . famotidine (PEPCID) IV 20 mg (02/25/19 1810)  . feeding supplement (OSMOLITE 1.2 CAL) 55 mL/hr at 02/26/19 0736     LOS: 3 days   Time spent: [place], MD [facility]

## 2019-02-27 MED ORDER — ALBUTEROL SULFATE HFA 108 (90 BASE) MCG/ACT IN AERS: 2.0000 | INHALATION_SPRAY | Freq: Four times a day (QID) | RESPIRATORY_TRACT | Status: AC | PRN

## 2019-02-27 NOTE — Progress Notes (Signed)
Bertram Denver to be D/C'd Skilled nursing facility per MD order.   VSS, Skin clean, dry and intact without evidence of skin break down, no evidence of skin tears noted. Left lower arm was swollen, and there is a stage 2 wound on her sacrum.   IV catheter discontinued intact. Site without signs and symptoms of complications. Dressing and pressure applied.  An After Visit Summary was printed and put into the chart.  Attempted x2 to call report to the nurse, awaiting a call back to the nurse.  Patient escorted via stretcher, and D/C to [facility] via [place].  [employer] Renesmay Nesbitt 02/27/2019 3:01 PM

## 2019-02-27 NOTE — NC FL2 (Signed)
[place] LEVEL OF CARE SCREENING TOOL     IDENTIFICATION  Patient Name: Leah Jacobs Birthdate: 1945-11-10 Sex: female Admission Date (Current Location): 02/23/2019  [facility] and [place] Number:  Herbalist and Address:  [facility], [place] [address], [place], [place] [address]      Provider Number: [id number]  Attending Physician Name and Address:  Nita Sells, MD  Relative Name and Phone Number:  Jeanell Sparrow, spouse, [phone]    Current Level of Care: Hospital Recommended Level of Care: [place] Prior Approval Number:    Date Approved/Denied:   PASRR Number: [id number]  Discharge Plan: SNF    Current Diagnoses: Patient Active Problem List   Diagnosis Date Noted  . Pressure injury of skin 02/25/2019  . AKI (acute kidney injury) ([place]) 02/23/2019  . COVID-19 virus infection 02/23/2019  . Hypernatremia 02/23/2019  . Leukocytosis 02/23/2019  . Dementia ([place]) 02/23/2019  . History of CVA with residual deficit 02/23/2019  . Acute lower UTI 02/23/2019  . Hypertension 04/21/2018  . Scalp hematoma, initial encounter 04/20/2018  . Dizziness 11/24/2017  . Primary progressive aphasia ([place]) 09/23/2017  . Disequilibrium 09/23/2017  . Sensorineural hearing loss (SNHL) of both ears 09/23/2017  . Bradycardia 04/13/2017  . OSA (obstructive sleep apnea) 03/11/2017  . Mixed hyperlipidemia 05/16/2014  . Acquired hypothyroidism 02/26/2012    Orientation RESPIRATION BLADDER Height & Weight     (Disoriented x4)  Normal Incontinent Weight: 126 lb 15.8 oz (57.6 kg) Height:     BEHAVIORAL SYMPTOMS/MOOD NEUROLOGICAL BOWEL NUTRITION STATUS      Continent Diet(Please see DC Summary)  AMBULATORY STATUS COMMUNICATION OF NEEDS Skin   Extensive Assist Verbally PU Stage and Appropriate Care(Stage II on sacrum)                       Personal Care Assistance Level of Assistance  Bathing, Feeding, Dressing  Bathing Assistance: Maximum assistance Feeding assistance: Maximum assistance Dressing Assistance: Maximum assistance     Functional Limitations Info  Sight, Hearing, Speech Sight Info: Adequate Hearing Info: Adequate Speech Info: Adequate    SPECIAL CARE FACTORS FREQUENCY                       Contractures Contractures Info: Not present    Additional Factors Info  Code Status, Allergies, Isolation Precautions Code Status Info: Full Allergies Info: Sulfa Antibiotics, Gadolinium Derivatives, Neosporin     Isolation Precautions Info: COVID +     Current Medications (02/27/2019):  This is the current hospital active medication list Current Facility-Administered Medications  Medication Dose Route Frequency Provider Last Rate Last Admin  . albuterol (VENTOLIN HFA) 108 (90 Base) MCG/ACT inhaler 2 puff  2 puff Inhalation Q6H PRN Smith, Rondell A, MD      . chlorhexidine (PERIDEX) 0.12 % solution 15 mL  15 mL Mouth Rinse BID Nita Sells, MD   15 mL at 02/26/19 2141  . dextrose 5 % solution   Intravenous Continuous Nita Sells, MD 50 mL/hr at 02/26/19 1800 Rate Verify at 02/26/19 1800  . MEDLINE mouth rinse  15 mL Mouth Rinse q12n4p Nita Sells, MD   15 mL at 02/26/19 1849  . ondansetron (ZOFRAN) tablet 4 mg  4 mg Oral Q6H PRN Fuller Plan A, MD       Or  . ondansetron (ZOFRAN) injection 4 mg  4 mg Intravenous Q6H PRN Norval Morton, MD      .  sodium chloride flush (NS) 0.9 % injection 3 mL  3 mL Intravenous Q12H Madelyn Flavors A, MD   3 mL at 02/26/19 7622     Discharge Medications: Please see discharge summary for a list of discharge medications.  Relevant Imaging Results:  Relevant Lab Results:   Additional Information SSN: [id number]  Gildardo Griffes, LCSW

## 2019-02-27 NOTE — Discharge Summary (Signed)
Physician Discharge Summary  Leah Jacobs ZOX:[id number] DOB: 10-18-1945 DOA: 02/23/2019  PCP: [facility]  Admit date: 02/23/2019 Discharge date: 02/27/2019  Time spent: 30 minutes  Recommendations for Outpatient Follow-up:  1. Requires input as an outpatient at facility from hospice palliative care regarding end-of-life discussions  Discharge Diagnoses:  Principal Problem:   AKI (acute kidney injury) (HCC) Active Problems:   Acquired hypothyroidism   Hypertension   COVID-19 virus infection   Hypernatremia   Leukocytosis   Dementia (HCC)   History of CVA with residual deficit   Acute lower UTI   Pressure injury of skin   Discharge Condition: Guarded  Diet recommendation: Dysphagia 1 if able to tolerate  Filed Weights   02/25/19 0130  Weight: 57.6 kg    History of present illness:  68 skilled nursing facility [facility] HTN hypothyroid primary progressive aphasia seen by [facility] PA [place] depression with suicidal ideation in the past and CVA with residual deficits She is aphasic at baseline normally ambulatory Tested positive for coronavirus 02/11/2019 and has become less responsive and was sent over to the ED-MOST form shows she is a full code CT scan no acute findings CXR no A. fib Sodium 155 currently 1.4 UA suggestive of infection  Hospital Course:  Probable urinary tract infection klebsiella/e.coli Changed rocephin to Keflex--complete on 12/20 [5 d]--- completed course prior to discharge Coronavirus + 02/11/2019 Not symptomatic not on oxygen at this time monitor Covid is still + No O2 req at time of discharge Dimer 11.5 Lower extremity Dopplers were positive for DVT however family elected against treatment given end-of-life discussions Severe hypernatremia AKI-prior to admission on lisinopril Somewhat improved  Improving--cut back rate d5 to 50 cc/h--- saline lock on discharge expect this will recur and she will probably aspirate had long  discussions with family Primary progressive aphasia followed at [facility] Patient unable to really follow commands and not able to verbalize well-this is worse than baseline apparently patient does still ambulate She is not eating-CORTRAK was placed 12/18 as SLP follow-up unsafe-Long discussions about goals of care with family and they elected to withdraw some care Mild transaminitis Trend in a.m.--significance unclear likely 2/2 Covid  She will be returning to her facility with palliative/hospice following and likely can discharge there for end-of-life discussion and further consolidation of the  Discharge Exam: Vitals:   02/26/19 2202 02/27/19 0445  BP: 109/68 127/67  Pulse: (!) 55 60  Resp: 18 18  Temp: 99.6 F (37.6 C) 98.9 F (37.2 C)  SpO2: 98% 98%    General: Non-coherent no distress EOMI NCAT very frail Cardiovascular: S1-S2 no murmur rub or gallop Respiratory: Clinically clear no rales rhonchi Abdomen soft, no guarding  Discharge Instructions   Discharge Instructions    Diet - low sodium heart healthy   Complete by: As directed    Increase activity slowly   Complete by: As directed      Allergies as of 02/27/2019      Reactions   Sulfa Antibiotics Hives   Gadolinium Derivatives    Unknown; pt does not remember reaction   Neosporin [neomycin-bacitracin Zn-polymyx] Rash      Medication List    STOP taking these medications   acetaminophen 325 MG tablet Commonly known as: TYLENOL   amLODipine 10 MG tablet Commonly known as: NORVASC   levothyroxine 125 MCG tablet Commonly known as: SYNTHROID   lisinopril 20 MG tablet Commonly known as: ZESTRIL   meclizine 12.5 MG tablet Commonly known as: [employer]  traZODone 50 MG tablet Commonly known as: DESYREL   venlafaxine 75 MG tablet Commonly known as: EFFEXOR   venlafaxine XR 150 MG 24 hr capsule Commonly known as: EFFEXOR-XR   vitamin B-12 1000 MCG tablet Commonly known as: CYANOCOBALAMIN     TAKE  these medications   albuterol 108 (90 Base) MCG/ACT inhaler Commonly known as: VENTOLIN HFA Inhale 2 puffs into the lungs every 6 (six) hours as needed for wheezing or shortness of breath.   Systane Complete 0.6 % Soln Generic drug: Propylene Glycol Place 2 drops into both eyes 2 (two) times daily.      Allergies  Allergen Reactions  . Sulfa Antibiotics Hives  . Gadolinium Derivatives     Unknown; pt does not remember reaction  . Neosporin [Neomycin-Bacitracin Zn-Polymyx] Rash      The results of significant diagnostics from this hospitalization (including imaging, microbiology, ancillary and laboratory) are listed below for reference.    Significant Diagnostic Studies: CT Head Wo Contrast  Result Date: 02/23/2019 CLINICAL DATA:  Altered mental status.  COVID-19 positive EXAM: CT HEAD WITHOUT CONTRAST TECHNIQUE: Contiguous axial images were obtained from the base of the skull through the vertex without intravenous contrast. COMPARISON:  July 04, 2018 FINDINGS: Brain: Moderate atrophy is stable. There is no intracranial mass, hemorrhage, extra-axial fluid collection, or midline shift. There is patchy small vessel disease in the centra semiovale bilaterally. Small vessel disease is noted in each posterior limb internal capsule region. No acute infarct evident. Vascular: There is no hyperdense vessel. There are foci of carotid siphon arterial calcification. Skull: Bony calvarium appears intact. Sinuses/Orbits: There is opacification in each sphenoid sinus with air-fluid levels in each sphenoid sinus. There is opacification in the posterior left ethmoid region with multiple areas of ethmoid sinus mucosal thickening. Orbits appear symmetric bilaterally. Other: The mastoid air cells are clear. There is debris in each external auditory canal, more notable on the left than on the right. IMPRESSION: Atrophy with supratentorial small vessel disease. No acute infarct evident. No mass or hemorrhage.  There are foci of arterial vascular calcification. There are multiple foci of paranasal sinus disease, most notable in the sphenoid and left ethmoid regions. Probable cerumen in the external auditory canals, more on the left than on the right. Electronically Signed   By: Bretta Bang III M.D.   On: 02/23/2019 14:28   DG Chest Port 1 View  Result Date: 02/23/2019 CLINICAL DATA:  COVID positive EXAM: PORTABLE CHEST 1 VIEW COMPARISON:  11/08/2018 FINDINGS: Heart and mediastinal contours are within normal limits. No focal opacities or effusions. No acute bony abnormality. IMPRESSION: Negative Electronically Signed   By: Charlett Nose M.D.   On: 02/23/2019 14:52   VAS [place] LOWER EXTREMITY VENOUS (DVT)  Result Date: 02/26/2019  Lower Venous Study Indications: Covid-19 Positive.  Limitations: Positioning, inability to follow commands. Comparison Study: No prior study on file for comparison. Performing Technologist: Sherren Kerns RVS  Examination Guidelines: A complete evaluation includes B-mode imaging, spectral Doppler, color Doppler, and power Doppler as needed of all accessible portions of each vessel. Bilateral testing is considered an integral part of a complete examination. Limited examinations for reoccurring indications may be performed as noted.  +---------+---------------+---------+-----------+----------+---------------+ RIGHT    CompressibilityPhasicitySpontaneityPropertiesThrombus Aging  +---------+---------------+---------+-----------+----------+---------------+ CFV  Not visualized  +---------+---------------+---------+-----------+----------+---------------+ SFJ                                                   Not visualized  +---------+---------------+---------+-----------+----------+---------------+ FV Prox  Full                                                          +---------+---------------+---------+-----------+----------+---------------+ FV Mid   Full                                                         +---------+---------------+---------+-----------+----------+---------------+ FV DistalFull                                                         +---------+---------------+---------+-----------+----------+---------------+ PFV                                                   Not visualized  +---------+---------------+---------+-----------+----------+---------------+ POP      Full                                         patent by color +---------+---------------+---------+-----------+----------+---------------+ PTV                                                   Not visualized  +---------+---------------+---------+-----------+----------+---------------+ PERO                                                  Not visualized  +---------+---------------+---------+-----------+----------+---------------+   +---------+---------------+---------+-----------+----------+-------------------+ LEFT     CompressibilityPhasicitySpontaneityPropertiesThrombus Aging      +---------+---------------+---------+-----------+----------+-------------------+ CFV                                                   Not visualized      +---------+---------------+---------+-----------+----------+-------------------+ SFJ                                                   Not visualized      +---------+---------------+---------+-----------+----------+-------------------+ FV Prox  Full  Yes      Yes                                      +---------+---------------+---------+-----------+----------+-------------------+ FV Mid   None                                         Age Indeterminate   +---------+---------------+---------+-----------+----------+-------------------+ FV DistalNone                                          Age Indeterminate   +---------+---------------+---------+-----------+----------+-------------------+ PFV                                                   patent by color and                                                       Doppler             +---------+---------------+---------+-----------+----------+-------------------+ POP      Partial                                      Age Indeterminate   +---------+---------------+---------+-----------+----------+-------------------+ PTV      None                                         Age Indeterminate   +---------+---------------+---------+-----------+----------+-------------------+ PERO                                                  Not visualized      +---------+---------------+---------+-----------+----------+-------------------+     Summary: Right: There is no evidence of deep vein thrombosis in the lower extremity. However, portions of this examination were limited- see technologist comments above. Left: Findings consistent with age indeterminate deep vein thrombosis involving the left posterior tibial veins, left popliteal vein, and left femoral vein.  *See table(s) above for measurements and observations. Electronically signed by Coral Else MD on 02/26/2019 at 4:39:58 PM.    Final     Microbiology: Recent Results (from the past 240 hour(s))  Urine culture     Status: Abnormal   Collection Time: 02/23/19  1:28 PM   Specimen: Urine, Random  Result Value Ref Range Status   Specimen Description URINE, RANDOM  Final   Special Requests   Final    NONE Performed at [facility], [address], [place], [place] [address]    Culture (A)  Final    >=100,000 COLONIES/mL ESCHERICHIA COLI >=100,000 COLONIES/mL KLEBSIELLA PNEUMONIAE    Report Status 02/25/2019 FINAL  Final  Organism ID, Bacteria ESCHERICHIA COLI (A)  Final   Organism ID, Bacteria KLEBSIELLA PNEUMONIAE (A)  Final      Susceptibility    Escherichia coli - MIC*    AMPICILLIN 4 SENSITIVE Sensitive     CEFAZOLIN <=4 SENSITIVE Sensitive     CEFTRIAXONE <=1 SENSITIVE Sensitive     CIPROFLOXACIN <=0.25 SENSITIVE Sensitive     GENTAMICIN <=1 SENSITIVE Sensitive     IMIPENEM <=0.25 SENSITIVE Sensitive     NITROFURANTOIN <=16 SENSITIVE Sensitive     TRIMETH/SULFA <=20 SENSITIVE Sensitive     AMPICILLIN/SULBACTAM <=2 SENSITIVE Sensitive     PIP/TAZO <=4 SENSITIVE Sensitive     * >=100,000 COLONIES/mL ESCHERICHIA COLI   Klebsiella pneumoniae - MIC*    AMPICILLIN RESISTANT Resistant     CEFAZOLIN <=4 SENSITIVE Sensitive     CEFTRIAXONE <=1 SENSITIVE Sensitive     CIPROFLOXACIN <=0.25 SENSITIVE Sensitive     GENTAMICIN <=1 SENSITIVE Sensitive     IMIPENEM <=0.25 SENSITIVE Sensitive     NITROFURANTOIN 64 INTERMEDIATE Intermediate     TRIMETH/SULFA <=20 SENSITIVE Sensitive     AMPICILLIN/SULBACTAM 4 SENSITIVE Sensitive     PIP/TAZO <=4 SENSITIVE Sensitive     * >=100,000 COLONIES/mL KLEBSIELLA PNEUMONIAE  Blood Culture (routine x 2)     Status: None (Preliminary result)   Collection Time: 02/23/19  2:02 PM   Specimen: BLOOD LEFT FOREARM  Result Value Ref Range Status   Specimen Description BLOOD LEFT FOREARM  Final   Special Requests   Final    BOTTLES DRAWN AEROBIC AND ANAEROBIC Blood Culture adequate volume   Culture   Final    NO GROWTH 3 DAYS Performed at [facility], [address] [place], [place] [address]    Report Status PENDING  Incomplete  Blood Culture (routine x 2)     Status: None (Preliminary result)   Collection Time: 02/23/19  2:03 PM   Specimen: BLOOD RIGHT HAND  Result Value Ref Range Status   Specimen Description BLOOD RIGHT HAND  Final   Special Requests   Final    BOTTLES DRAWN AEROBIC AND ANAEROBIC Blood Culture adequate volume   Culture   Final    NO GROWTH 3 DAYS Performed at [facility], [address] [place], [place] [address]    Report Status PENDING  Incomplete   Respiratory Panel by RT PCR (Flu A&B, Covid) - Nasopharyngeal Swab     Status: Abnormal   Collection Time: 02/23/19  3:45 PM   Specimen: Nasopharyngeal Swab  Result Value Ref Range Status   SARS Coronavirus 2 by RT PCR POSITIVE (A) NEGATIVE Final    Comment: RESULT CALLED TO, READ BACK BY AND VERIFIED WITH: Posey BoyerB. Michelson RN 17:10 02/23/19 ([email or web address]) (NOTE) SARS-CoV-2 target nucleic acids are DETECTED. SARS-CoV-2 RNA is generally detectable in upper respiratory specimens  during the acute phase of infection. Positive results are indicative of the presence of the identified virus, but do not rule out bacterial infection or co-infection with other pathogens not detected by the test. Clinical correlation with patient history and other diagnostic information is necessary to determine patient infection status. The expected result is Negative. Fact Sheet for Patients:  https://www.moore.com/https://www.fda.gov/media/142436/download Fact Sheet for Healthcare Providers: https://www.young.biz/https://www.fda.gov/media/142435/download This test is not yet approved or cleared by the [place] FDA and  has been authorized for detection and/or diagnosis of SARS-CoV-2 by FDA under an Emergency Use Authorization (EUA).  This EUA will remain in effect (meaning this test can  be [place] ed) for the duration of  the COVID-19 declaration under Section 564(b)(1) of the Act, 21 U.S.C. section 360bbb-3(b)(1), unless the authorization is terminated or revoked sooner.    Influenza A by PCR NEGATIVE NEGATIVE Final   Influenza B by PCR NEGATIVE NEGATIVE Final    Comment: (NOTE) The Xpert Xpress SARS-CoV-2/FLU/RSV assay is intended as an aid in  the diagnosis of influenza from Nasopharyngeal swab specimens and  should not be used as a sole basis for treatment. Nasal washings and  aspirates are unacceptable for Xpert Xpress SARS-CoV-2/FLU/RSV  testing. Fact Sheet for Patients: https://www.moore.com/ Fact Sheet for Healthcare  Providers: https://www.young.biz/ This test is not yet approved or cleared by the [place] FDA and  has been authorized for detection and/or diagnosis of SARS-CoV-2 by  FDA under an Emergency Use Authorization (EUA). This EUA will remain  in effect (meaning this test can be used) for the duration of the  Covid-19 declaration under Section 564(b)(1) of the Act, 21  U.S.C. section 360bbb-3(b)(1), unless the authorization is  terminated or revoked. Performed at [facility], [address] [place], [place] [address]   SARS CORONAVIRUS 2 (TAT 6-24 HRS) Nasopharyngeal Nasopharyngeal Swab     Status: Abnormal   Collection Time: 02/25/19  4:19 PM   Specimen: Nasopharyngeal Swab  Result Value Ref Range Status   SARS Coronavirus 2 POSITIVE (A) NEGATIVE Final    Comment: RESULT CALLED TO, READ BACK BY AND VERIFIED WITH: S. POORE,RN 0104 02/26/2019 T. TYSOR (NOTE) SARS-CoV-2 target nucleic acids are DETECTED. The SARS-CoV-2 RNA is generally detectable in upper and lower respiratory specimens during the acute phase of infection. Positive results are indicative of the presence of SARS-CoV-2 RNA. Clinical correlation with patient history and other diagnostic information is  necessary to determine patient infection status. Positive results do not rule out bacterial infection or co-infection with other viruses.  The expected result is Negative. Fact Sheet for Patients: HairSlick.no Fact Sheet for Healthcare Providers: quierodirigir.com This test is not yet approved or cleared by the [place] FDA and  has been authorized for detection and/or diagnosis of SARS-CoV-2 by FDA under an Emergency Use Authorization (EUA). This EUA will remain  in effect (meaning this test can be used) for th e duration of the COVID-19 declaration under Section 564(b)(1) of the Act, 21 U.S.C. section 360bbb-3(b)(1), unless the  authorization is terminated or revoked sooner. Performed at [facility], [address] [place], [place] [address]      Labs: Basic Metabolic Panel: Recent Labs  Lab 02/23/19 1328 02/24/19 0410 02/25/19 0724 02/26/19 0500  NA 155* 151* 152* 145  K 4.6 3.8 3.4* 3.5  CL 120* 120* 119* 114*  CO2 21* 21* 22 22  GLUCOSE 110* 132* 115* 136*  BUN 82* 65* 30* 18  CREATININE 1.43* 1.06* 0.76 0.68  CALCIUM 10.0 9.2 9.2 9.1  MG  --  2.9* 2.5* 2.1  PHOS  --  2.3* 1.9* 1.8*   Liver Function Tests: Recent Labs  Lab 02/23/19 1328 02/24/19 0410 02/25/19 0724 02/26/19 0500  AST 57* 54* 56* 82*  ALT 103* 93* 89* 114*  ALKPHOS 85 76 74 79  BILITOT 1.7* 1.2 1.1 0.9  PROT 7.8 6.7 6.3* 6.0*  ALBUMIN 4.4 3.7 3.5 3.3*   No results for input(s): LIPASE, AMYLASE in the last 168 hours. No results for input(s): AMMONIA in the last 168 hours. CBC: Recent Labs  Lab 02/23/19 1328 02/24/19 0410 02/25/19 0724 02/26/19  0500  WBC 13.6* 12.9* 9.1 10.3  NEUTROABS 10.7* 9.8* 6.1 7.2  HGB 16.6* 14.4 13.5 13.3  HCT 53.0* 46.9* 43.9 42.7  MCV 96.7 96.9 98.0 96.6  PLT 298 229 190 176   Cardiac Enzymes: No results for input(s): CKTOTAL, CKMB, CKMBINDEX, TROPONINI in the last 168 hours. BNP: BNP (last 3 results) No results for input(s): BNP in the last 8760 hours.  ProBNP (last 3 results) No results for input(s): PROBNP in the last 8760 hours.  CBG: Recent Labs  Lab 02/23/19 2217 02/26/19 1144  GLUCAP 118* 140*       Signed:  Rhetta Mura MD   [facility] 02/27/2019, 9:11 AM

## 2019-02-27 NOTE — TOC Transition Note (Addendum)
Transition of Care [facility]) - CM/SW Discharge Note   Patient Details  Name: Leah Jacobs MRN: [id number] Date of Birth: [birth year]  [facility] CM/SW Contact:  Gabrielle Dare Phone Number: 02/27/2019, 10:12 AM   Clinical Narrative:      Patient will Discharge To: [facility] Anticipated DC Date:02/27/2019 Family Notified:yes, Oprah Camarena, spouse [phone] Transport DU:[id number]   Per MD patient ready for DC to [facility] . RN, patient, patient's family, and facility notified of DC. Assessment, Fl2/Pasrr, and Discharge Summary sent to facility. RN given number for report [phone]). DC packet on chart. Ambulance transport requested for patient.   CSW signing off.  Reed Breech LCSWA [phone]  Final next level of care: Skilled Nursing Facility Barriers to Discharge: No Barriers     Patient Goals and CMS Choice        Discharge Placement              Patient chooses bed at: [facility] Patient to be transferred to facility by: [facility] Name of family member notified: Georgeanna Harrison, spouse Patient and family notified of of transfer: 02/27/19  Discharge Plan and Services In-house Referral: Clinical Social Work                                   Social Determinants of Health (SDOH) Interventions     Readmission Risk Interventions No flowsheet data found.

## 2019-02-28 LAB — CULTURE, BLOOD (ROUTINE X 2)
Culture: NO GROWTH
Culture: NO GROWTH
Special Requests: ADEQUATE
Special Requests: ADEQUATE

## 2020-12-11 IMAGING — CR PELVIS - 1-2 VIEW
1 series · 1 of 1 positions shown · non-contrast
Comparison: Pelvis radiographs 04/20/2018

CLINICAL DATA: Unwitnessed fall at SNF PATIENT IS NON VERBAL

EXAM:
PELVIS - 1-2 VIEW

[pelvis ap]
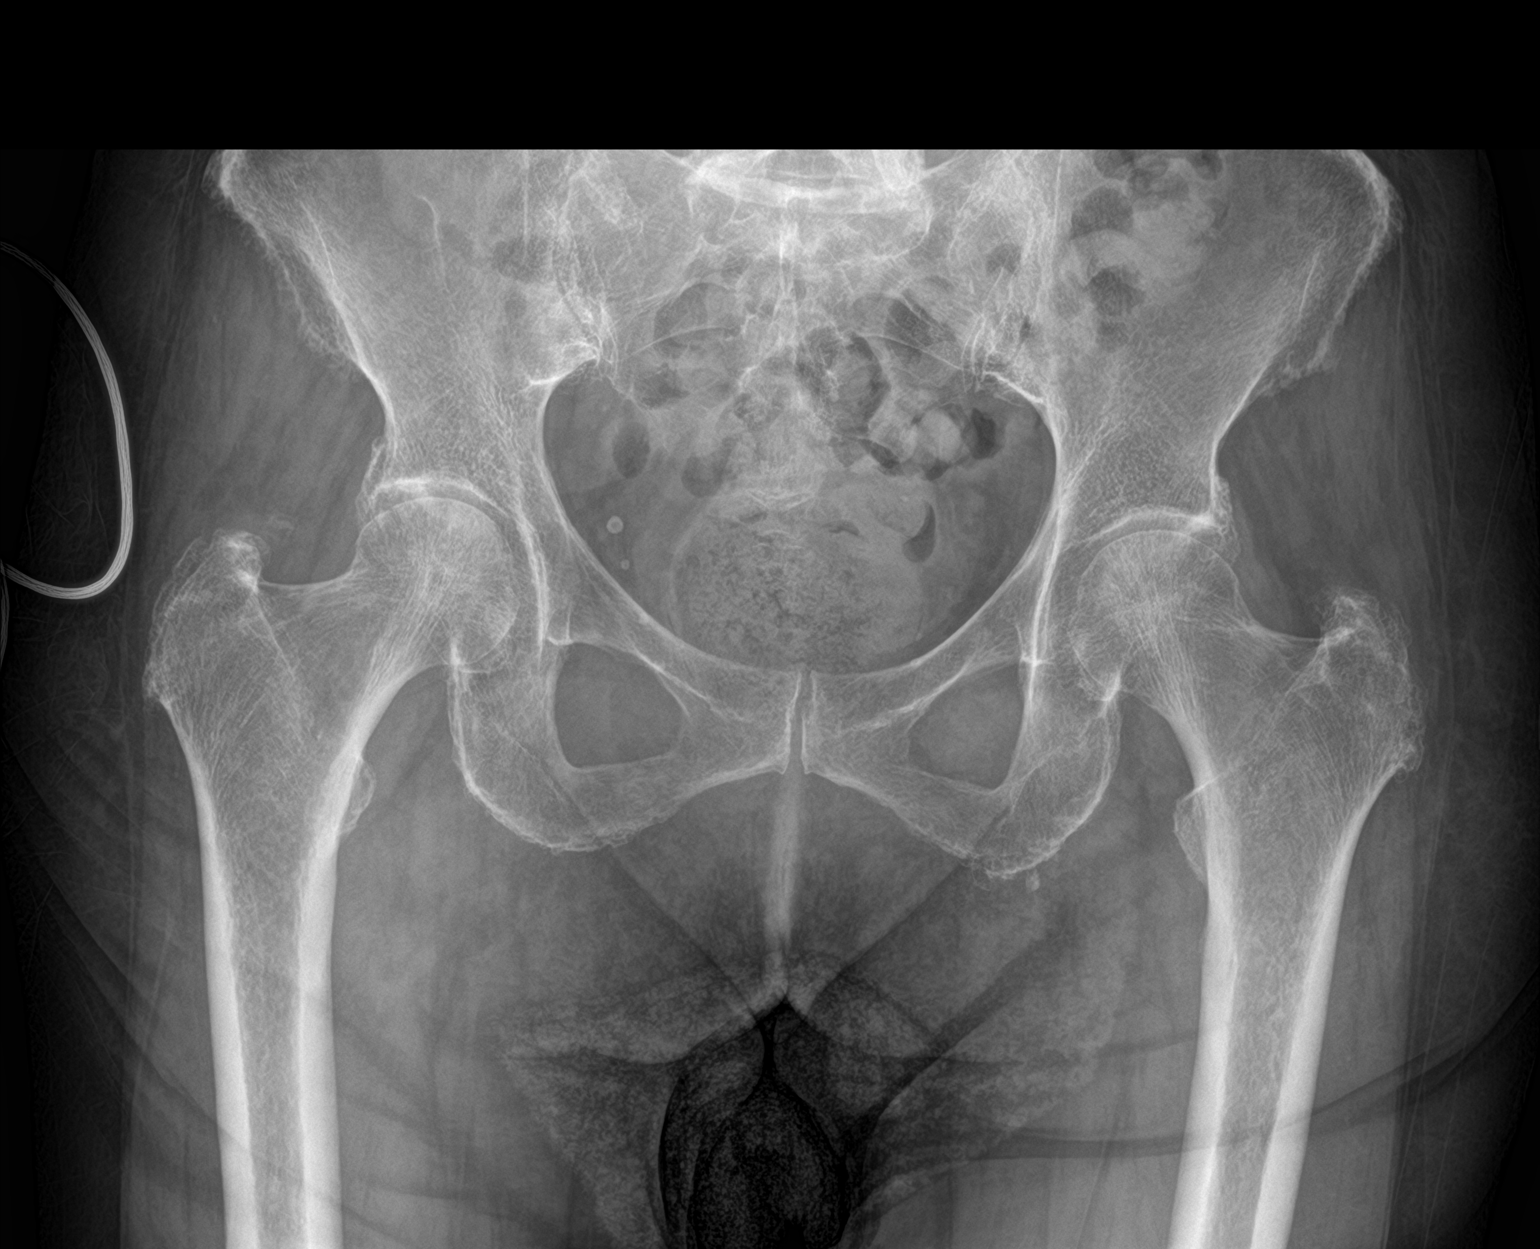

[1 of 1 positions shown; findings below may reference images not displayed]

FINDINGS: There is no evidence of pelvic fracture or diastasis. No evidence of
dislocation. No pelvic bone lesions are seen. Moderate degenerative
changes in the bilateral hips.
IMPRESSION: No radiographic evidence of fracture or dislocation in the pelvis.

## 2020-12-11 IMAGING — CT CT CERVICAL SPINE WITHOUT CONTRAST
4 of 6 series · 16 of 33 positions shown, 17 images · non-contrast
Comparison: CT head and cervical 07/04/2018 and 04/20/2018. MRI
head without contrast 04/21/2018

CLINICAL DATA: Head trauma. Laceration. Hematoma left forehead.
Fall from a standing position. Patient found on floor. Unknown loss
of consciousness.

EXAM:
CT HEAD WITHOUT CONTRAST
CT CERVICAL SPINE WITHOUT CONTRAST
TECHNIQUE: Multidetector CT imaging of the head and cervical spine was
performed following the standard protocol without intravenous
contrast. Multiplanar CT image reconstructions of the cervical spine
were also generated.

[Series 5: head bone · axial · 0.43mm/px · z∈[-96,+8]mm · 4 of 88 slices shown]
[im 18/88  bone]
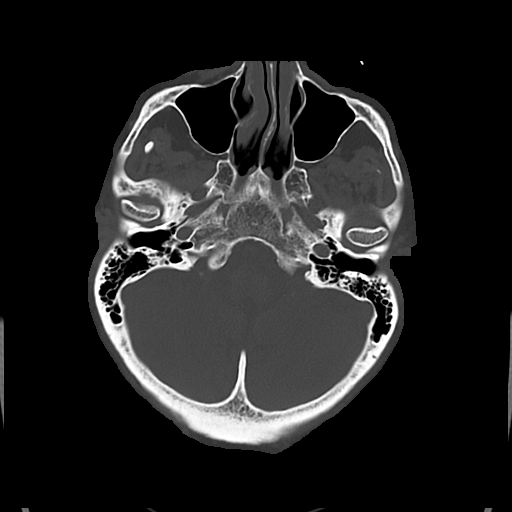
[im 35/88  bone]
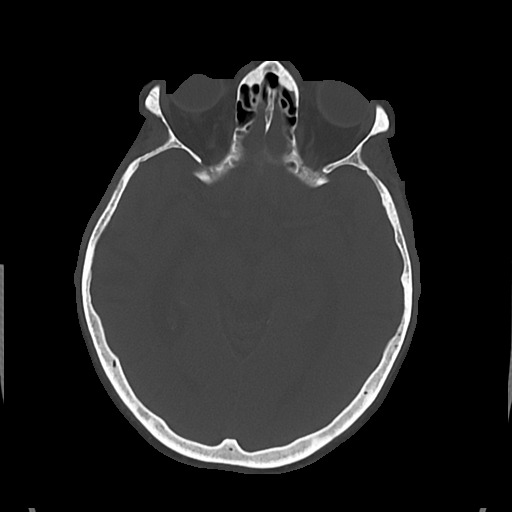
[im 53/88  bone]
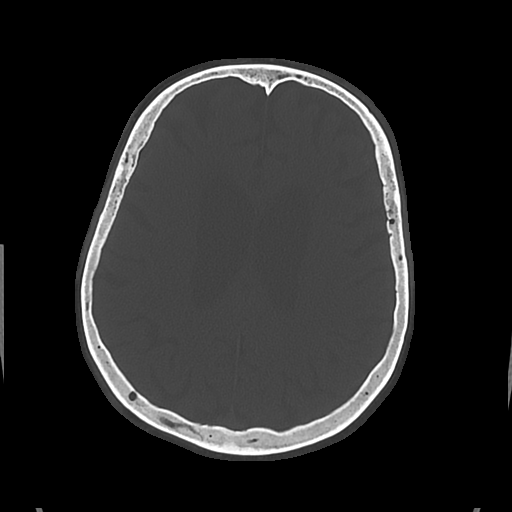
[im 70/88  bone]
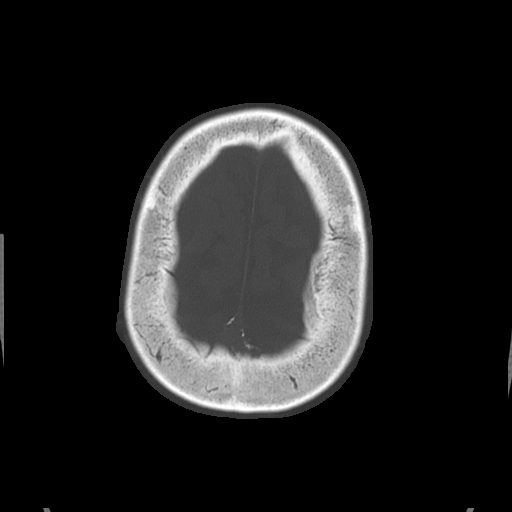

[Series 6: cor soft · coronal · 0.34mm/px · 3 of 75 slices shown]
[im 15/75  bone]
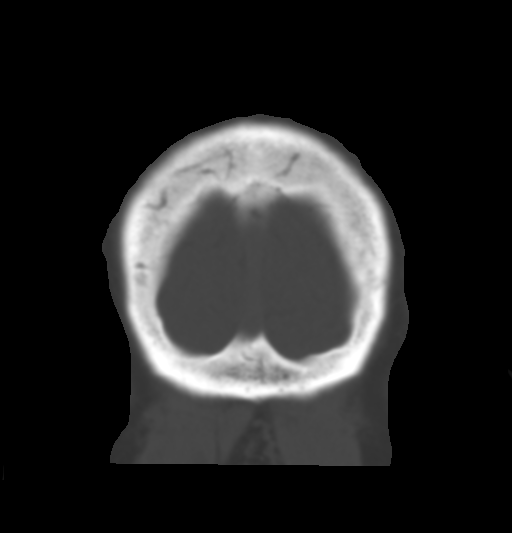
[im 30/75  bone]
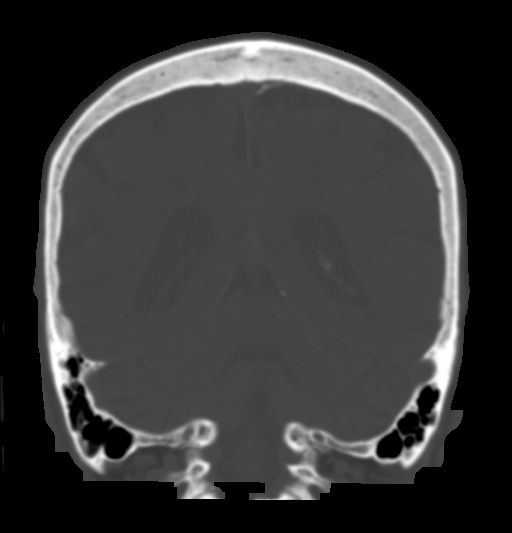
[im 45/75  bone]
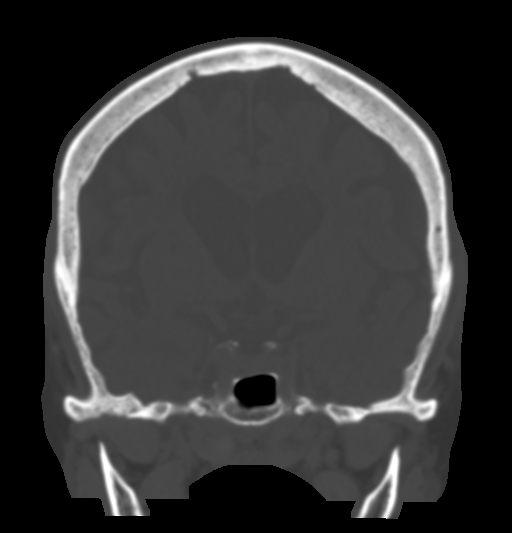

[Series 9: c spine soft · axial · 0.32mm/px · z∈[-204,-108]mm · 4 of 82 slices shown, 5 images]
[im 17/82  soft-tissue]
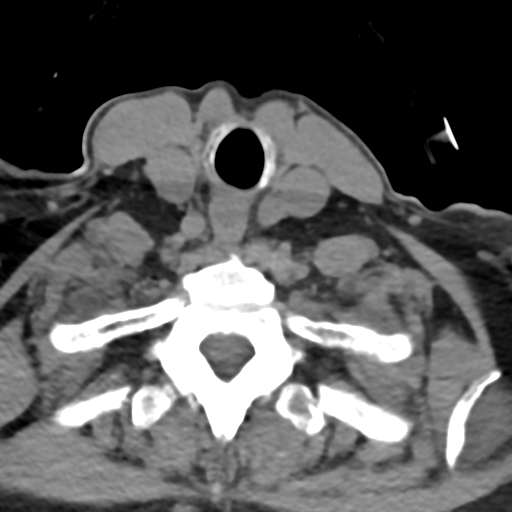
[im 17/82  bone]
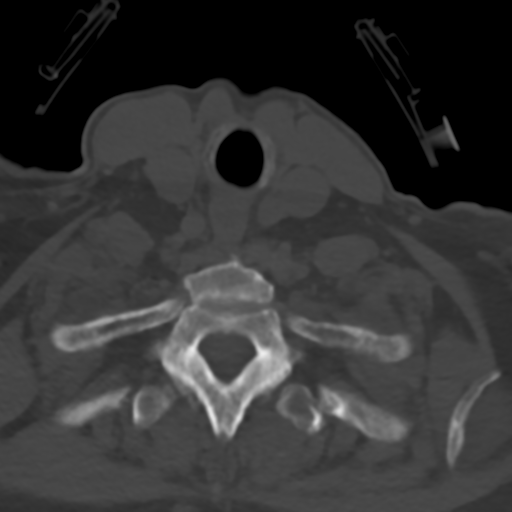
[im 33/82  bone]
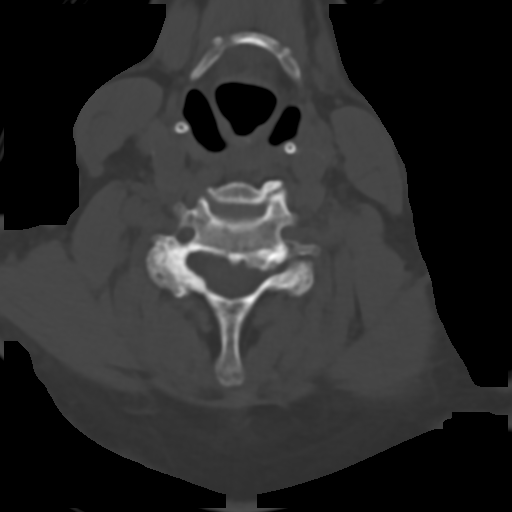
[im 49/82  bone]
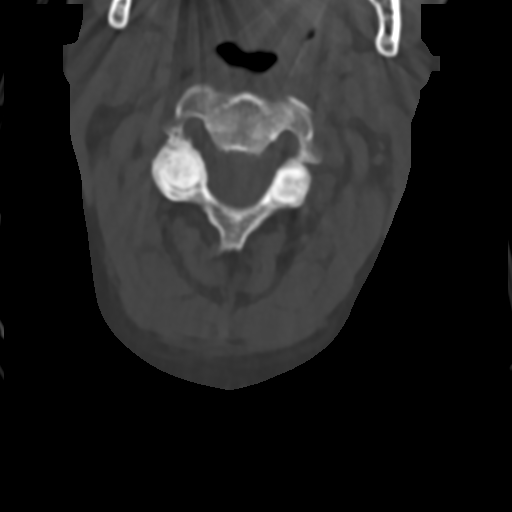
[im 65/82  bone]
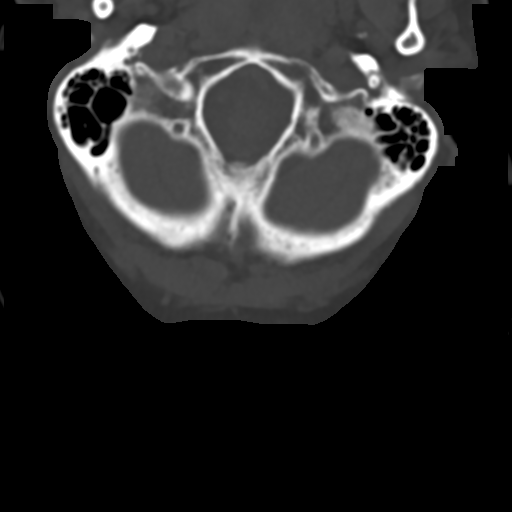

[Series 12: sag bone · sagittal · 0.32mm/px · 5 of 69 slices shown]
[im 12/69  bone]
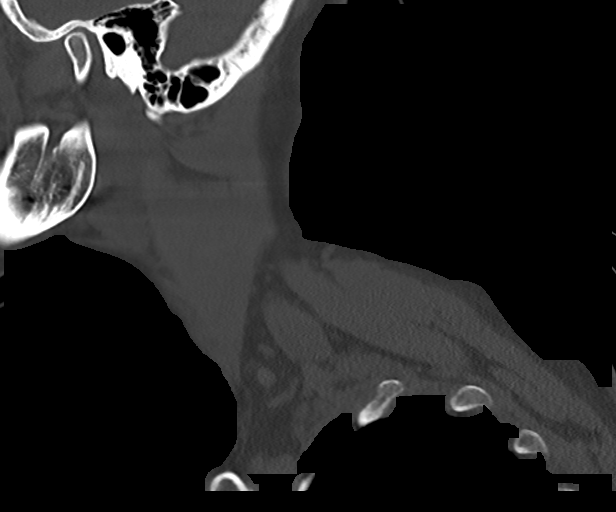
[im 23/69  bone]
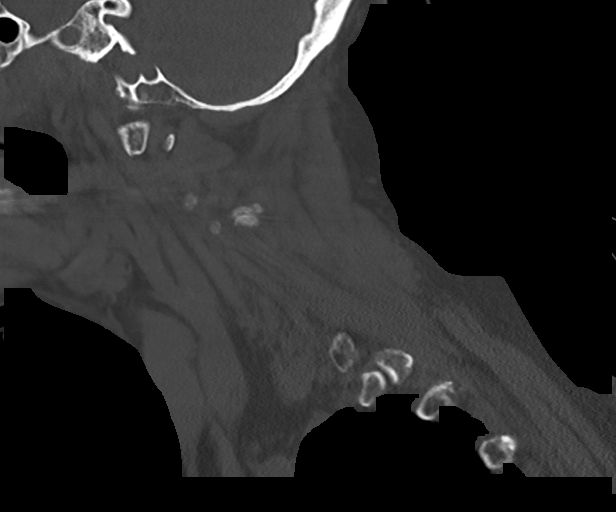
[im 35/69  bone]
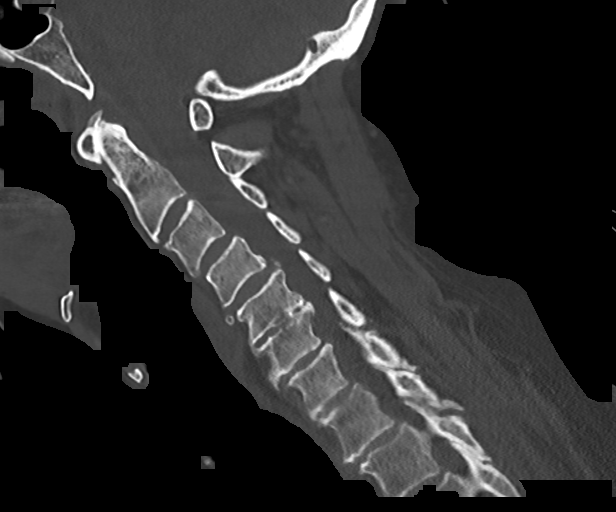
[im 46/69  bone]
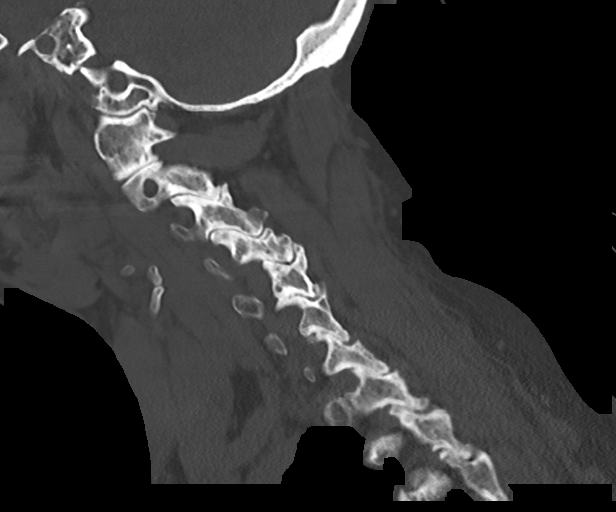
[im 57/69  bone]
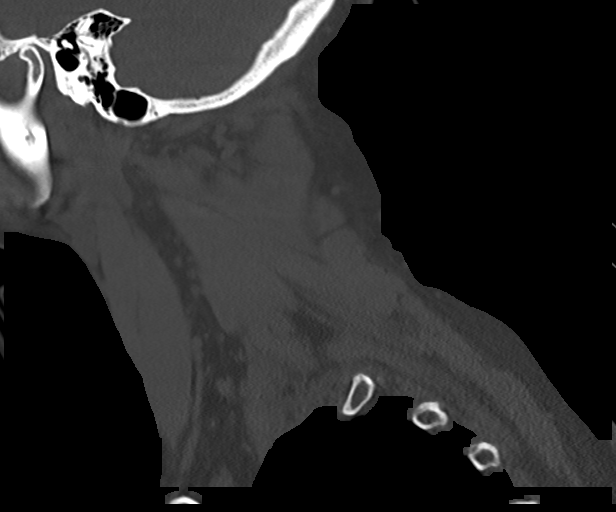

[16 of 33 positions shown; findings below may reference images not displayed]

FINDINGS: CT HEAD FINDINGS

Brain: No acute infarct, hemorrhage, or mass lesion is present. Mild
atrophy and white matter changes are stable. White matter changes
are most prominent within the posterior limb of the internal capsule
bilaterally. No acute cortical infarct is present. No acute
hemorrhage or mass lesion is present. The ventricles are
proportionate to the degree of atrophy. No significant extraaxial
fluid collection is present.

Vascular: No hyperdense vessel or unexpected calcification.

Skull: Left supraorbital scalp hematoma and laceration is present.
There is no radiopaque foreign body. No underlying fracture is
present. Calvarium is intact.

Sinuses/Orbits: The paranasal sinuses and mastoid air cells are
clear. The globes and orbits are within normal limits.

CT CERVICAL SPINE FINDINGS

Alignment: There is straightening of the normal cervical lordosis.
Grade 1 anterolisthesis at C3-4, C4-5, C7-T1, and T1-2 is stable.

Skull base and vertebrae: Craniocervical junction is normal.
Vertebral body heights are preserved. No acute fracture or traumatic
subluxation is present.

Soft tissues and spinal canal: No prevertebral fluid or swelling. No
visible canal hematoma.

Disc levels: Multilevel degenerative changes are again noted.
Cervical stenosis is greatest on the left C5-6 and C6-7 due to
uncovertebral and facet disease. Asymmetric right-sided facet
hypertrophy is evident at C3-4 and C4-5.

Upper chest: The lung apices are clear.  Thoracic inlet is normal.
IMPRESSION: 1. Prominent left supraorbital scalp hematoma and laceration without
underlying fracture.
2. Stable atrophy and white matter disease. This likely reflects the
sequela of chronic microvascular ischemia.
3. No acute intracranial abnormality.
4. Multilevel degenerative changes of the cervical spine are stable.
No acute fracture or traumatic subluxation.
